# Patient Record
Sex: Male | Born: 1950 | Race: Black or African American | Hispanic: No | Marital: Married | Smoking: Former smoker
Health system: Southern US, Community
[De-identification: ages and names within clinical notes are randomized; demographics above are authoritative.]

## PROBLEM LIST (undated history)

## (undated) DIAGNOSIS — C801 Malignant (primary) neoplasm, unspecified: Secondary | ICD-10-CM

## (undated) HISTORY — DX: Malignant (primary) neoplasm, unspecified: C80.1

## (undated) HISTORY — PX: TONSILLECTOMY: SUR1361

## (undated) HISTORY — PX: SHOULDER SURGERY: SHX246

## (undated) HISTORY — PX: OTHER SURGICAL HISTORY: SHX169

---

## 2016-09-14 DIAGNOSIS — R059 Cough, unspecified: Secondary | ICD-10-CM | POA: Insufficient documentation

## 2016-09-14 DIAGNOSIS — N2889 Other specified disorders of kidney and ureter: Secondary | ICD-10-CM | POA: Insufficient documentation

## 2017-01-14 HISTORY — PX: ROBOTIC ASSITED PARTIAL NEPHRECTOMY: SHX6087

## 2017-09-02 ENCOUNTER — Encounter: Payer: Self-pay | Admitting: Hematology

## 2017-09-02 ENCOUNTER — Telehealth: Payer: Self-pay | Admitting: Hematology

## 2017-09-02 NOTE — Telephone Encounter (Signed)
New oncology referral received from Dr. Brendia Sacks for lytic bone lesions, suspicious for multiple myeloma. Spoke to the pt's wife to schedule an appt. Pt has been scheduled to see Dr. Irene Limbo on 9/3 at [place] the pt should arrive 30 minutes early. Letter mailed.
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
## 2017-09-16 ENCOUNTER — Telehealth: Payer: Self-pay | Admitting: Hematology

## 2017-09-16 ENCOUNTER — Inpatient Hospital Stay: Payer: Medicare Other

## 2017-09-16 ENCOUNTER — Encounter: Payer: Self-pay | Admitting: Hematology

## 2017-09-16 ENCOUNTER — Inpatient Hospital Stay: Payer: Medicare Other | Attending: Hematology | Admitting: Hematology

## 2017-09-16 VITALS — BP 149/88 | HR 87 | Temp 98.3°F | Resp 18 | Ht 68.0 in | Wt 244.1 lb

## 2017-09-16 DIAGNOSIS — G9589 Other specified diseases of spinal cord: Secondary | ICD-10-CM

## 2017-09-16 DIAGNOSIS — Z85828 Personal history of other malignant neoplasm of skin: Secondary | ICD-10-CM | POA: Diagnosis not present

## 2017-09-16 DIAGNOSIS — C7951 Secondary malignant neoplasm of bone: Secondary | ICD-10-CM

## 2017-09-16 DIAGNOSIS — Z809 Family history of malignant neoplasm, unspecified: Secondary | ICD-10-CM | POA: Insufficient documentation

## 2017-09-16 DIAGNOSIS — R911 Solitary pulmonary nodule: Secondary | ICD-10-CM | POA: Diagnosis not present

## 2017-09-16 DIAGNOSIS — Z85528 Personal history of other malignant neoplasm of kidney: Secondary | ICD-10-CM | POA: Diagnosis not present

## 2017-09-16 DIAGNOSIS — E559 Vitamin D deficiency, unspecified: Secondary | ICD-10-CM

## 2017-09-16 DIAGNOSIS — R519 Headache, unspecified: Secondary | ICD-10-CM

## 2017-09-16 DIAGNOSIS — M899 Disorder of bone, unspecified: Secondary | ICD-10-CM | POA: Diagnosis present

## 2017-09-16 DIAGNOSIS — R51 Headache: Secondary | ICD-10-CM

## 2017-09-16 DIAGNOSIS — C801 Malignant (primary) neoplasm, unspecified: Secondary | ICD-10-CM

## 2017-09-16 LAB — CBC WITH DIFFERENTIAL/PLATELET
BASOS PCT: 0 %
Basophils Absolute: 0 10*3/uL (ref 0.0–0.1)
EOS ABS: 0.1 10*3/uL (ref 0.0–0.5)
EOS PCT: 1 %
HCT: 38.3 % — ABNORMAL LOW (ref 38.4–49.9)
Hemoglobin: 12.1 g/dL — ABNORMAL LOW (ref 13.0–17.1)
Lymphocytes Relative: 22 %
Lymphs Abs: 2.9 10*3/uL (ref 0.9–3.3)
MCH: 26.8 pg — ABNORMAL LOW (ref 27.2–33.4)
MCHC: 31.6 g/dL — ABNORMAL LOW (ref 32.0–36.0)
MCV: 84.9 fL (ref 79.3–98.0)
MONO ABS: 0.9 10*3/uL (ref 0.1–0.9)
Monocytes Relative: 7 %
Neutro Abs: 9.5 10*3/uL — ABNORMAL HIGH (ref 1.5–6.5)
Neutrophils Relative %: 70 %
PLATELETS: 265 10*3/uL (ref 140–400)
RBC: 4.51 MIL/uL (ref 4.20–5.82)
RDW: 13.8 % (ref 11.0–14.6)
WBC: 13.4 10*3/uL — ABNORMAL HIGH (ref 4.0–10.3)

## 2017-09-16 LAB — CMP (CANCER CENTER ONLY)
ALT: <6 U/L (ref 0–44)
AST: 11 U/L — ABNORMAL LOW (ref 15–41)
Albumin: 3.5 g/dL (ref 3.5–5.0)
Alkaline Phosphatase: 146 U/L — ABNORMAL HIGH (ref 38–126)
Anion gap: 10 (ref 5–15)
BUN: 17 mg/dL (ref 8–23)
CO2: 25 mmol/L (ref 22–32)
Calcium: 9.4 mg/dL (ref 8.9–10.3)
Chloride: 106 mmol/L (ref 98–111)
Creatinine: 1.34 mg/dL — ABNORMAL HIGH (ref 0.61–1.24)
GFR, Est AFR Am: >60 mL/min
GFR, Estimated: 53 mL/min — ABNORMAL LOW
Glucose, Bld: 98 mg/dL (ref 70–99)
Potassium: 4.3 mmol/L (ref 3.5–5.1)
Sodium: 141 mmol/L (ref 135–145)
Total Bilirubin: 0.5 mg/dL (ref 0.3–1.2)
Total Protein: 7.6 g/dL (ref 6.5–8.1)

## 2017-09-16 LAB — SEDIMENTATION RATE: Sed Rate: 54 mm/h — ABNORMAL HIGH (ref 0–16)

## 2017-09-16 LAB — LACTATE DEHYDROGENASE: LDH: 154 U/L (ref 98–192)

## 2017-09-16 NOTE — Telephone Encounter (Signed)
Scheduled appt per 9/3 los -gave patient AVS and calender per los.  

## 2017-09-17 LAB — KAPPA/LAMBDA LIGHT CHAINS
Kappa free light chain: 63.2 mg/L — ABNORMAL HIGH (ref 3.3–19.4)
Kappa, lambda light chain ratio: 2.57 — ABNORMAL HIGH (ref 0.26–1.65)
Lambda free light chains: 24.6 mg/L (ref 5.7–26.3)

## 2017-09-17 LAB — MULTIPLE MYELOMA PANEL, SERUM
ALBUMIN SERPL ELPH-MCNC: 3.4 g/dL (ref 2.9–4.4)
Albumin/Glob SerPl: 1 (ref 0.7–1.7)
Alpha 1: 0.3 g/dL (ref 0.0–0.4)
Alpha2 Glob SerPl Elph-Mcnc: 0.7 g/dL (ref 0.4–1.0)
B-GLOBULIN SERPL ELPH-MCNC: 1.3 g/dL (ref 0.7–1.3)
GAMMA GLOB SERPL ELPH-MCNC: 1.2 g/dL (ref 0.4–1.8)
GLOBULIN, TOTAL: 3.5 g/dL (ref 2.2–3.9)
IgA: 425 mg/dL (ref 61–437)
IgG (Immunoglobin G), Serum: 1328 mg/dL (ref 700–1600)
IgM (Immunoglobulin M), Srm: 31 mg/dL (ref 20–172)
Total Protein ELP: 6.9 g/dL (ref 6.0–8.5)

## 2017-09-17 LAB — PROSTATE-SPECIFIC AG, SERUM (LABCORP): Prostate Specific Ag, Serum: 0.8 ng/mL (ref 0.0–4.0)

## 2017-09-17 LAB — VITAMIN D 25 HYDROXY (VIT D DEFICIENCY, FRACTURES): VIT D 25 HYDROXY: 9.9 ng/mL — AB (ref 30.0–100.0)

## 2017-09-17 LAB — BETA 2 MICROGLOBULIN, SERUM: Beta-2 Microglobulin: 2.6 mg/L — ABNORMAL HIGH (ref 0.6–2.4)

## 2017-09-26 ENCOUNTER — Ambulatory Visit (HOSPITAL_COMMUNITY)
Admission: RE | Admit: 2017-09-26 | Discharge: 2017-09-26 | Disposition: A | Discharge status: Home / Self Care | Payer: Medicare Other | Source: Ambulatory Visit | Attending: Hematology | Admitting: Hematology

## 2017-09-26 DIAGNOSIS — R9389 Abnormal findings on diagnostic imaging of other specified body structures: Secondary | ICD-10-CM | POA: Diagnosis not present

## 2017-09-26 DIAGNOSIS — R911 Solitary pulmonary nodule: Secondary | ICD-10-CM | POA: Insufficient documentation

## 2017-09-26 DIAGNOSIS — I700 Atherosclerosis of aorta: Secondary | ICD-10-CM | POA: Insufficient documentation

## 2017-09-26 DIAGNOSIS — I77811 Abdominal aortic ectasia: Secondary | ICD-10-CM | POA: Insufficient documentation

## 2017-09-26 DIAGNOSIS — C7951 Secondary malignant neoplasm of bone: Secondary | ICD-10-CM

## 2017-09-26 DIAGNOSIS — C801 Malignant (primary) neoplasm, unspecified: Secondary | ICD-10-CM

## 2017-09-26 LAB — GLUCOSE, CAPILLARY: Glucose-Capillary: 95 mg/dL (ref 70–99)

## 2017-09-26 MED ORDER — FLUDEOXYGLUCOSE F - 18 (FDG) INJECTION
12.2500 | Freq: Once | INTRAVENOUS | Status: AC
  Administered 2017-09-26: 12.25 via INTRAVENOUS

## 2017-09-30 ENCOUNTER — Ambulatory Visit (HOSPITAL_COMMUNITY)
Admission: RE | Admit: 2017-09-30 | Discharge: 2017-09-30 | Disposition: A | Discharge status: Home / Self Care | Payer: Medicare Other | Source: Ambulatory Visit | Attending: Hematology | Admitting: Hematology

## 2017-09-30 ENCOUNTER — Encounter (HOSPITAL_COMMUNITY): Payer: Self-pay

## 2017-09-30 DIAGNOSIS — R519 Headache, unspecified: Secondary | ICD-10-CM

## 2017-09-30 DIAGNOSIS — C801 Malignant (primary) neoplasm, unspecified: Secondary | ICD-10-CM | POA: Insufficient documentation

## 2017-09-30 DIAGNOSIS — R51 Headache: Secondary | ICD-10-CM | POA: Diagnosis present

## 2017-09-30 MED ORDER — IOHEXOL 300 MG/ML  SOLN
75.0000 mL | Freq: Once | INTRAMUSCULAR | Status: AC | PRN
  Administered 2017-09-30: 75 mL via INTRAVENOUS
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
## 2017-10-08 ENCOUNTER — Inpatient Hospital Stay (HOSPITAL_BASED_OUTPATIENT_CLINIC_OR_DEPARTMENT_OTHER): Payer: Medicare Other | Admitting: Hematology

## 2017-10-08 ENCOUNTER — Telehealth: Payer: Self-pay

## 2017-10-08 VITALS — BP 164/94 | HR 92 | Temp 98.4°F | Resp 18 | Ht 68.0 in | Wt 243.4 lb

## 2017-10-08 DIAGNOSIS — G9589 Other specified diseases of spinal cord: Secondary | ICD-10-CM | POA: Diagnosis not present

## 2017-10-08 DIAGNOSIS — Z85528 Personal history of other malignant neoplasm of kidney: Secondary | ICD-10-CM | POA: Diagnosis not present

## 2017-10-08 DIAGNOSIS — R911 Solitary pulmonary nodule: Secondary | ICD-10-CM | POA: Diagnosis not present

## 2017-10-08 DIAGNOSIS — E559 Vitamin D deficiency, unspecified: Secondary | ICD-10-CM

## 2017-10-08 DIAGNOSIS — M899 Disorder of bone, unspecified: Secondary | ICD-10-CM | POA: Diagnosis not present

## 2017-10-08 MED ORDER — ERGOCALCIFEROL 1.25 MG (50000 UT) PO CAPS: 50000.0000 [IU] | ORAL_CAPSULE | ORAL | 0 refills | Status: DC

## 2017-10-08 NOTE — Telephone Encounter (Signed)
Printed avs and calender of upcoming appointment. Per 9/25 los 
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
## 2018-02-05 ENCOUNTER — Inpatient Hospital Stay: Payer: Medicare Other | Attending: Hematology

## 2018-02-05 ENCOUNTER — Telehealth: Payer: Self-pay

## 2018-02-05 ENCOUNTER — Inpatient Hospital Stay (HOSPITAL_BASED_OUTPATIENT_CLINIC_OR_DEPARTMENT_OTHER): Payer: Medicare Other | Admitting: Hematology

## 2018-02-05 VITALS — BP 180/80 | HR 83 | Temp 98.4°F | Resp 18 | Ht 68.0 in | Wt 233.9 lb

## 2018-02-05 DIAGNOSIS — R918 Other nonspecific abnormal finding of lung field: Secondary | ICD-10-CM

## 2018-02-05 DIAGNOSIS — Z79899 Other long term (current) drug therapy: Secondary | ICD-10-CM | POA: Insufficient documentation

## 2018-02-05 DIAGNOSIS — R937 Abnormal findings on diagnostic imaging of other parts of musculoskeletal system: Secondary | ICD-10-CM | POA: Insufficient documentation

## 2018-02-05 DIAGNOSIS — M899 Disorder of bone, unspecified: Secondary | ICD-10-CM

## 2018-02-05 DIAGNOSIS — Z85528 Personal history of other malignant neoplasm of kidney: Secondary | ICD-10-CM

## 2018-02-05 DIAGNOSIS — Z87891 Personal history of nicotine dependence: Secondary | ICD-10-CM

## 2018-02-05 DIAGNOSIS — E559 Vitamin D deficiency, unspecified: Secondary | ICD-10-CM

## 2018-02-05 DIAGNOSIS — C7951 Secondary malignant neoplasm of bone: Secondary | ICD-10-CM

## 2018-02-05 LAB — CBC WITH DIFFERENTIAL/PLATELET
Abs Immature Granulocytes: 0.03 10*3/uL (ref 0.00–0.07)
Basophils Absolute: 0.1 10*3/uL (ref 0.0–0.1)
Basophils Relative: 1 %
EOS ABS: 0.2 10*3/uL (ref 0.0–0.5)
EOS PCT: 2 %
HEMATOCRIT: 39.4 % (ref 39.0–52.0)
HEMOGLOBIN: 12.1 g/dL — AB (ref 13.0–17.0)
Immature Granulocytes: 0 %
LYMPHS PCT: 27 %
Lymphs Abs: 2.6 10*3/uL (ref 0.7–4.0)
MCH: 26.8 pg (ref 26.0–34.0)
MCHC: 30.7 g/dL (ref 30.0–36.0)
MCV: 87.2 fL (ref 80.0–100.0)
MONO ABS: 0.8 10*3/uL (ref 0.1–1.0)
Monocytes Relative: 9 %
Neutro Abs: 6 10*3/uL (ref 1.7–7.7)
Neutrophils Relative %: 61 %
Platelets: 243 10*3/uL (ref 150–400)
RBC: 4.52 MIL/uL (ref 4.22–5.81)
RDW: 13.2 % (ref 11.5–15.5)
WBC: 9.7 10*3/uL (ref 4.0–10.5)
nRBC: 0 % (ref 0.0–0.2)

## 2018-02-05 LAB — CMP (CANCER CENTER ONLY)
ALK PHOS: 139 U/L — AB (ref 38–126)
ALT: 10 U/L (ref 0–44)
AST: 15 U/L (ref 15–41)
Albumin: 3.8 g/dL (ref 3.5–5.0)
Anion gap: 7 (ref 5–15)
BILIRUBIN TOTAL: 0.6 mg/dL (ref 0.3–1.2)
BUN: 25 mg/dL — AB (ref 8–23)
CALCIUM: 9.3 mg/dL (ref 8.9–10.3)
CHLORIDE: 107 mmol/L (ref 98–111)
CO2: 27 mmol/L (ref 22–32)
CREATININE: 1.52 mg/dL — AB (ref 0.61–1.24)
GFR, EST NON AFRICAN AMERICAN: 46 mL/min — AB (ref >60)
GFR, Est AFR Am: 54 mL/min — ABNORMAL LOW (ref >60)
Glucose, Bld: 96 mg/dL (ref 70–99)
Potassium: 5 mmol/L (ref 3.5–5.1)
Sodium: 141 mmol/L (ref 135–145)
Total Protein: 7.4 g/dL (ref 6.5–8.1)

## 2018-02-05 MED ORDER — ERGOCALCIFEROL 1.25 MG (50000 UT) PO CAPS: 50000.0000 [IU] | ORAL_CAPSULE | ORAL | 0 refills | Status: DC

## 2018-02-05 NOTE — Telephone Encounter (Signed)
Printed avs and calender of upcoming appointment. Per 1/23 los also gave patient contrast, and CT information

## 2018-02-06 LAB — KAPPA/LAMBDA LIGHT CHAINS
Kappa free light chain: 63.6 mg/L — ABNORMAL HIGH (ref 3.3–19.4)
Kappa, lambda light chain ratio: 2.6 — ABNORMAL HIGH (ref 0.26–1.65)
Lambda free light chains: 24.5 mg/L (ref 5.7–26.3)

## 2018-02-06 LAB — VITAMIN D 25 HYDROXY (VIT D DEFICIENCY, FRACTURES): VIT D 25 HYDROXY: 32.3 ng/mL (ref 30.0–100.0)

## 2018-02-08 LAB — MULTIPLE MYELOMA PANEL, SERUM
ALBUMIN/GLOB SERPL: 1.4 (ref 0.7–1.7)
Albumin SerPl Elph-Mcnc: 3.9 g/dL (ref 2.9–4.4)
Alpha 1: 0.2 g/dL (ref 0.0–0.4)
Alpha2 Glob SerPl Elph-Mcnc: 0.5 g/dL (ref 0.4–1.0)
B-Globulin SerPl Elph-Mcnc: 1 g/dL (ref 0.7–1.3)
Gamma Glob SerPl Elph-Mcnc: 1.2 g/dL (ref 0.4–1.8)
Globulin, Total: 3 g/dL (ref 2.2–3.9)
IGM (IMMUNOGLOBULIN M), SRM: 26 mg/dL (ref 20–172)
IgA: 386 mg/dL (ref 61–437)
IgG (Immunoglobin G), Serum: 1315 mg/dL (ref 700–1600)
Total Protein ELP: 6.9 g/dL (ref 6.0–8.5)

## 2018-04-30 ENCOUNTER — Ambulatory Visit (HOSPITAL_COMMUNITY): Admission: RE | Admit: 2018-04-30 | Payer: Medicare Other | Source: Ambulatory Visit

## 2018-05-05 ENCOUNTER — Telehealth: Payer: Self-pay | Admitting: Hematology

## 2018-05-05 NOTE — Telephone Encounter (Signed)
Called patient per 4/21 sch message - unable to reach patient to reschedule appt - left message for patient to call back

## 2018-05-07 ENCOUNTER — Ambulatory Visit: Payer: Medicare Other | Admitting: Hematology

## 2018-05-07 ENCOUNTER — Other Ambulatory Visit: Payer: Medicare Other

## 2018-05-09 ENCOUNTER — Other Ambulatory Visit: Payer: Self-pay | Admitting: Hematology

## 2018-08-06 ENCOUNTER — Inpatient Hospital Stay: Payer: Medicare Other | Admitting: Hematology

## 2018-08-06 ENCOUNTER — Inpatient Hospital Stay: Payer: Medicare Other

## 2020-03-25 ENCOUNTER — Other Ambulatory Visit: Payer: Self-pay

## 2020-03-25 ENCOUNTER — Emergency Department (HOSPITAL_COMMUNITY): Payer: Medicare Other

## 2020-03-25 ENCOUNTER — Encounter (HOSPITAL_COMMUNITY): Payer: Self-pay | Admitting: Emergency Medicine

## 2020-03-25 ENCOUNTER — Inpatient Hospital Stay (HOSPITAL_COMMUNITY)
Admission: EM | Admit: 2020-03-25 | Discharge: 2020-03-28 | DRG: 305 | Disposition: A | Discharge status: Home / Self Care | Payer: Medicare Other | Attending: Internal Medicine | Admitting: Internal Medicine

## 2020-03-25 DIAGNOSIS — E876 Hypokalemia: Secondary | ICD-10-CM | POA: Diagnosis not present

## 2020-03-25 DIAGNOSIS — N1832 Chronic kidney disease, stage 3b: Secondary | ICD-10-CM | POA: Diagnosis present

## 2020-03-25 DIAGNOSIS — I160 Hypertensive urgency: Principal | ICD-10-CM | POA: Diagnosis present

## 2020-03-25 DIAGNOSIS — Z87891 Personal history of nicotine dependence: Secondary | ICD-10-CM

## 2020-03-25 DIAGNOSIS — I10 Essential (primary) hypertension: Secondary | ICD-10-CM

## 2020-03-25 DIAGNOSIS — N400 Enlarged prostate without lower urinary tract symptoms: Secondary | ICD-10-CM | POA: Diagnosis present

## 2020-03-25 DIAGNOSIS — I351 Nonrheumatic aortic (valve) insufficiency: Secondary | ICD-10-CM | POA: Diagnosis not present

## 2020-03-25 DIAGNOSIS — Z9114 Patient's other noncompliance with medication regimen: Secondary | ICD-10-CM

## 2020-03-25 DIAGNOSIS — Z905 Acquired absence of kidney: Secondary | ICD-10-CM

## 2020-03-25 DIAGNOSIS — M545 Low back pain, unspecified: Secondary | ICD-10-CM | POA: Diagnosis not present

## 2020-03-25 DIAGNOSIS — Z20822 Contact with and (suspected) exposure to covid-19: Secondary | ICD-10-CM | POA: Diagnosis present

## 2020-03-25 DIAGNOSIS — Z79899 Other long term (current) drug therapy: Secondary | ICD-10-CM

## 2020-03-25 DIAGNOSIS — I130 Hypertensive heart and chronic kidney disease with heart failure and stage 1 through stage 4 chronic kidney disease, or unspecified chronic kidney disease: Secondary | ICD-10-CM | POA: Diagnosis present

## 2020-03-25 DIAGNOSIS — I5020 Unspecified systolic (congestive) heart failure: Secondary | ICD-10-CM | POA: Diagnosis present

## 2020-03-25 DIAGNOSIS — R079 Chest pain, unspecified: Secondary | ICD-10-CM

## 2020-03-25 DIAGNOSIS — G8929 Other chronic pain: Secondary | ICD-10-CM | POA: Diagnosis not present

## 2020-03-25 DIAGNOSIS — R9431 Abnormal electrocardiogram [ECG] [EKG]: Secondary | ICD-10-CM | POA: Diagnosis not present

## 2020-03-25 DIAGNOSIS — Z85528 Personal history of other malignant neoplasm of kidney: Secondary | ICD-10-CM | POA: Diagnosis not present

## 2020-03-25 HISTORY — DX: Essential (primary) hypertension: I10

## 2020-03-25 LAB — TROPONIN I (HIGH SENSITIVITY)
Troponin I (High Sensitivity): 29 ng/L — ABNORMAL HIGH (ref <18)
Troponin I (High Sensitivity): 16 ng/L (ref <18)

## 2020-03-25 LAB — CBC WITH DIFFERENTIAL/PLATELET
Abs Immature Granulocytes: 0.03 10*3/uL (ref 0.00–0.07)
Basophils Absolute: 0.1 10*3/uL (ref 0.0–0.1)
Basophils Relative: 1 %
Eosinophils Absolute: 0.1 10*3/uL (ref 0.0–0.5)
Eosinophils Relative: 1 %
HCT: 42.8 % (ref 39.0–52.0)
Hemoglobin: 13.4 g/dL (ref 13.0–17.0)
Immature Granulocytes: 0 %
Lymphocytes Relative: 22 %
Lymphs Abs: 2.2 10*3/uL (ref 0.7–4.0)
MCH: 26.5 pg (ref 26.0–34.0)
MCHC: 31.3 g/dL (ref 30.0–36.0)
MCV: 84.6 fL (ref 80.0–100.0)
Monocytes Absolute: 0.8 10*3/uL (ref 0.1–1.0)
Monocytes Relative: 7 %
Neutro Abs: 7.2 10*3/uL (ref 1.7–7.7)
Neutrophils Relative %: 69 %
Platelets: 263 10*3/uL (ref 150–400)
RBC: 5.06 MIL/uL (ref 4.22–5.81)
RDW: 13.7 % (ref 11.5–15.5)
WBC: 10.4 10*3/uL (ref 4.0–10.5)
nRBC: 0 % (ref 0.0–0.2)

## 2020-03-25 LAB — COMPREHENSIVE METABOLIC PANEL
ALT: 9 U/L (ref 0–44)
AST: 14 U/L — ABNORMAL LOW (ref 15–41)
Albumin: 3.8 g/dL (ref 3.5–5.0)
Alkaline Phosphatase: 111 U/L (ref 38–126)
Anion gap: 7 (ref 5–15)
BUN: 18 mg/dL (ref 8–23)
CO2: 28 mmol/L (ref 22–32)
Calcium: 9 mg/dL (ref 8.9–10.3)
Chloride: 105 mmol/L (ref 98–111)
Creatinine, Ser: 1.58 mg/dL — ABNORMAL HIGH (ref 0.61–1.24)
GFR, Estimated: 47 mL/min — ABNORMAL LOW (ref >60)
Glucose, Bld: 99 mg/dL (ref 70–99)
Potassium: 3.4 mmol/L — ABNORMAL LOW (ref 3.5–5.1)
Sodium: 140 mmol/L (ref 135–145)
Total Bilirubin: 1 mg/dL (ref 0.3–1.2)
Total Protein: 7.4 g/dL (ref 6.5–8.1)

## 2020-03-25 LAB — D-DIMER, QUANTITATIVE: D-Dimer, Quant: 0.69 ug/mL-FEU — ABNORMAL HIGH (ref 0.00–0.50)

## 2020-03-25 LAB — BRAIN NATRIURETIC PEPTIDE: B Natriuretic Peptide: 90.7 pg/mL (ref 0.0–100.0)

## 2020-03-25 LAB — LACTIC ACID, PLASMA: Lactic Acid, Venous: 1.1 mmol/L (ref 0.5–1.9)

## 2020-03-25 MED ORDER — METHADONE HCL 10 MG PO TABS
10.0000 mg | ORAL_TABLET | Freq: Every day | ORAL | Status: DC
  Administered 2020-03-26 – 2020-03-27 (×2): 10 mg via ORAL
  Filled 2020-03-25 (×2): qty 1

## 2020-03-25 MED ORDER — ATORVASTATIN CALCIUM 40 MG PO TABS
40.0000 mg | ORAL_TABLET | Freq: Every day | ORAL | Status: DC
  Administered 2020-03-26 – 2020-03-28 (×4): 40 mg via ORAL
  Filled 2020-03-25 (×4): qty 1

## 2020-03-25 MED ORDER — ONDANSETRON HCL 4 MG/2ML IJ SOLN: 4.0000 mg | Freq: Four times a day (QID) | INTRAMUSCULAR | Status: DC | PRN

## 2020-03-25 MED ORDER — AMLODIPINE BESYLATE 5 MG PO TABS
5.0000 mg | ORAL_TABLET | Freq: Every day | ORAL | Status: DC
  Administered 2020-03-26 – 2020-03-28 (×4): 5 mg via ORAL
  Filled 2020-03-25 (×4): qty 1

## 2020-03-25 MED ORDER — TRIAMTERENE-HCTZ 37.5-25 MG PO TABS: 1.0000 | ORAL_TABLET | Freq: Every morning | ORAL | Status: DC

## 2020-03-25 MED ORDER — TRIAMTERENE-HCTZ 37.5-25 MG PO TABS
1.0000 | ORAL_TABLET | Freq: Every morning | ORAL | Status: DC
  Administered 2020-03-26 – 2020-03-28 (×3): 1 via ORAL
  Filled 2020-03-25 (×3): qty 1

## 2020-03-25 MED ORDER — HYDROMORPHONE HCL 2 MG/ML IJ SOLN
2.0000 mg | Freq: Once | INTRAMUSCULAR | Status: AC
  Administered 2020-03-25: 2 mg via INTRAVENOUS
  Filled 2020-03-25: qty 1

## 2020-03-25 MED ORDER — AMLODIPINE BESYLATE 5 MG PO TABS: 5.0000 mg | ORAL_TABLET | Freq: Every day | ORAL | Status: DC

## 2020-03-25 MED ORDER — METHADONE HCL 5 MG PO TABS: 10.0000 mg | ORAL_TABLET | Freq: Two times a day (BID) | ORAL | Status: DC

## 2020-03-25 MED ORDER — SODIUM CHLORIDE 0.9 % IV BOLUS
500.0000 mL | Freq: Once | INTRAVENOUS | Status: AC
  Administered 2020-03-25: 500 mL via INTRAVENOUS

## 2020-03-25 MED ORDER — POTASSIUM CHLORIDE 20 MEQ PO PACK
40.0000 meq | PACK | Freq: Once | ORAL | Status: AC
  Administered 2020-03-26: 40 meq via ORAL
  Filled 2020-03-25: qty 2

## 2020-03-25 MED ORDER — ONDANSETRON HCL 4 MG/2ML IJ SOLN
4.0000 mg | Freq: Once | INTRAMUSCULAR | Status: AC
  Administered 2020-03-25: 4 mg via INTRAVENOUS
  Filled 2020-03-25: qty 2

## 2020-03-25 MED ORDER — GABAPENTIN 300 MG PO CAPS
300.0000 mg | ORAL_CAPSULE | Freq: Every day | ORAL | Status: DC
  Administered 2020-03-26 – 2020-03-27 (×2): 300 mg via ORAL
  Filled 2020-03-25 (×2): qty 1

## 2020-03-25 MED ORDER — ASPIRIN 81 MG PO CHEW
81.0000 mg | CHEWABLE_TABLET | Freq: Every day | ORAL | Status: DC
  Administered 2020-03-26 – 2020-03-28 (×3): 81 mg via ORAL
  Filled 2020-03-25 (×3): qty 1

## 2020-03-25 MED ORDER — TAMSULOSIN HCL 0.4 MG PO CAPS
0.4000 mg | ORAL_CAPSULE | Freq: Every day | ORAL | Status: DC
  Administered 2020-03-26 – 2020-03-28 (×3): 0.4 mg via ORAL
  Filled 2020-03-25 (×3): qty 1

## 2020-03-25 MED ORDER — ENOXAPARIN SODIUM 40 MG/0.4ML ~~LOC~~ SOLN
40.0000 mg | SUBCUTANEOUS | Status: DC
  Administered 2020-03-26 – 2020-03-27 (×3): 40 mg via SUBCUTANEOUS
  Filled 2020-03-25 (×3): qty 0.4

## 2020-03-25 MED ORDER — METOPROLOL TARTRATE 25 MG PO TABS: 50.0000 mg | ORAL_TABLET | Freq: Two times a day (BID) | ORAL | Status: DC

## 2020-03-25 MED ORDER — IOHEXOL 350 MG/ML SOLN
100.0000 mL | Freq: Once | INTRAVENOUS | Status: AC | PRN
  Administered 2020-03-25: 71 mL via INTRAVENOUS

## 2020-03-25 MED ORDER — ASPIRIN 325 MG PO TABS
325.0000 mg | ORAL_TABLET | Freq: Once | ORAL | Status: AC
  Administered 2020-03-26: 325 mg via ORAL
  Filled 2020-03-25: qty 1

## 2020-03-25 MED ORDER — NITROGLYCERIN 2 % TD OINT
0.5000 [in_us] | TOPICAL_OINTMENT | Freq: Four times a day (QID) | TRANSDERMAL | Status: DC
  Administered 2020-03-26 – 2020-03-28 (×9): 0.5 [in_us] via TOPICAL
  Filled 2020-03-25: qty 30

## 2020-03-25 MED ORDER — METOPROLOL TARTRATE 50 MG PO TABS
50.0000 mg | ORAL_TABLET | Freq: Two times a day (BID) | ORAL | Status: DC
  Administered 2020-03-26 – 2020-03-28 (×6): 50 mg via ORAL
  Filled 2020-03-25 (×4): qty 1
  Filled 2020-03-25: qty 2
  Filled 2020-03-25: qty 1

## 2020-03-25 MED ORDER — ACETAMINOPHEN 325 MG PO TABS
650.0000 mg | ORAL_TABLET | ORAL | Status: DC | PRN
  Administered 2020-03-26 – 2020-03-28 (×6): 650 mg via ORAL
  Filled 2020-03-25 (×7): qty 2

## 2020-03-25 NOTE — ED Triage Notes (Signed)
Patient reports constant central dull chest pain x2 months.

## 2020-03-25 NOTE — H&P (Signed)
[facility] @ [place] Admission History and Physical [employer], D.O.  ---------------------------------------------------------------------------------------------------------------------   PATIENT NAMEJony Dixon MR#: [id number] DATE OF BIRTH: [birth year] DATE OF ADMISSION: 03/25/2020 PRIMARY CARE PHYSICIAN: Patient, No Pcp Per  REQUESTING/REFERRING PHYSICIAN: ED Dr. Joya Gaskins  CHIEF COMPLAINT: Chief Complaint  Patient presents with  . Chest Pain    HISTORY OF PRESENT ILLNESS: Ethan Dixon is a 70 y.o. male with a known history of renal cell carcinoma, CKD, HTN, BPH, chronic pain presents to the emergency department for evaluation of chest and back pain.  Patient was in a usual state of health until 3 months ago when he lost his relationship with his doctor who was prescribing him methadone and gabapentin.  He has not been taking his medication since about one week ago.  When he lost his doctor he tapered don on his meds because he knew it would be difficult to obtain them.  He finally ran out about 1 week ago.  He reports generalized symptoms such as weakness, headache, fatigue, chest pain, dyspnea on exertion for the past several months..  He takes gabapentin for neuropathic right leg pain and methadone for chronic back pain.    He would like to establish with a new PCP.   EMS/ED COURSE:   Patient received Dilaudid 2 mg x 2 doses, Zofran, normal saline.  Troponin was found to be elevated along with the patient's blood pressure.  Medical admission was requested for further work-up and management of elevated troponin and severe uncontrolled hypertension as well as pain likely chronic back pain.  PAST MEDICAL HISTORY: Past Medical History:  Diagnosis Date  . Cancer [facility])    Renal cell carcinoma left kidney      PAST SURGICAL HISTORY: Past Surgical History:  Procedure Laterality Date  . ROBOTIC ASSITED PARTIAL NEPHRECTOMY  2019      SOCIAL  HISTORY: Social History   Tobacco Use  . Smoking status: Former Smoker    Types: Cigarettes    Quit date: 2012    Years since quitting: 10.2  . Smokeless tobacco: Never Used  Substance Use Topics  . Alcohol use: Not Currently      FAMILY HISTORY: Family History  Problem Relation Age of Onset  . Diabetes Mother   . Hypertension Mother   . Diabetes Father   . Hypertension Father   . Cancer Sister   . Heart disease Sister   . Stroke Brother   . Cancer Sister      MEDICATIONS AT HOME: Prior to Admission medications   Medication Sig Start Date End Date Taking? Authorizing Provider  amLODipine (NORVASC) 5 MG tablet Take 5 mg by mouth daily.   Yes [provider]  gabapentin (NEURONTIN) 300 MG capsule Take 300 mg by mouth daily. 01/14/20  Yes [provider]  methadone (DOLOPHINE) 10 MG tablet Take 10 mg by mouth every 12 (twelve) hours.   Yes [provider]  metoprolol tartrate (LOPRESSOR) 50 MG tablet Take 50 mg by mouth 2 (two) times daily.   Yes [provider]  tamsulosin (FLOMAX) 0.4 MG CAPS capsule Take 0.4 mg by mouth daily. 01/14/20  Yes [provider]  triamterene-hydrochlorothiazide (MAXZIDE-25) 37.5-25 MG tablet Take 1 tablet by mouth every morning. 09/29/19  Yes [provider]  Vitamin D, Ergocalciferol, (DRISDOL) 1.25 MG (50000 UT) CAPS capsule TAKE ONE CAPSULE BY MOUTH TWICE PER WEEK 05/11/18  Yes Brunetta Genera, MD      DRUG ALLERGIES: No Known Allergies  REVIEW OF SYSTEMS: CONSTITUTIONAL: Positive fatigue, weakness, negative fever, chills, weight gain/loss, headache EYES: No blurry or double vision. ENT: No tinnitus, postnasal drip, redness or soreness of the oropharynx. RESPIRATORY: Positive dyspnea, negative cough, wheeze, hemoptysis. CARDIOVASCULAR: Positive chest pain, negative orthopnea, palpitations, syncope. GASTROINTESTINAL: No nausea, vomiting, constipation, diarrhea, abdominal pain. No  hematemesis, melena or hematochezia. GENITOURINARY: No dysuria, frequency, hematuria. ENDOCRINE: No polyuria or nocturia. No heat or cold intolerance. HEMATOLOGY: No anemia, bruising, bleeding. INTEGUMENTARY: No rashes, ulcers, lesions. MUSCULOSKELETAL: Positive muscular chest and back pain NEUROLOGIC: No numbness, tingling, weakness or ataxia. No seizure-type activity. PSYCHIATRIC: No anxiety, depression, insomnia.  PHYSICAL EXAMINATION: VITAL SIGNS: Blood pressure (!) 170/87, pulse 67, temperature 98.5 F (36.9 C), temperature source Oral, resp. rate 15, height 5\' 8"  (1.727 m), weight 122.5 kg, SpO2 97 %.  GENERAL: 70 y.o.-year-old male patient, well-developed, well-nourished lying in the bed in no acute distress.  Pleasant and cooperative.   HEENT: Head atraumatic, normocephalic.  NECK: Supple, full range of motion.  CHEST: Normal breath sounds bilaterally. No wheezing, rales, rhonchi or crackles. No use of accessory muscles of respiration.  No reproducible chest wall tenderness.  CARDIOVASCULAR: S1, S2 normal. No murmurs, rubs, or gallops appreciated. Cap refill <2 seconds. ABDOMEN: Soft, nontender, nondistended. No rebound, guarding, rigidity. Normoactive bowel sounds present in all four quadrants.  EXTREMITIES: Full range of motion. No pedal edema, cyanosis, or clubbing. NEUROLOGIC: Cranial nerves II through XII are grossly intact with no focal sensorimotor deficit. Muscle strength 5/5 in all extremities. Sensation intact. Gait not checked. PSYCHIATRIC: The patient is alert and oriented x 3. Normal affect, mood, thought content. SKIN: Warm, dry, and intact without obvious rash, lesion, or ulcer.  LABORATORY PANEL:  CBC Recent Labs  Lab 03/25/20 1419  WBC 10.4  HGB 13.4  HCT 42.8  PLT 263   ----------------------------------------------------------------------------------------------------------------- Chemistries Recent Labs  Lab 03/25/20 1419  NA 140  K 3.4*  CL 105   CO2 28  GLUCOSE 99  BUN 18  CREATININE 1.58*  CALCIUM 9.0  AST 14*  ALT 9  ALKPHOS 111  BILITOT 1.0   ------------------------------------------------------------------------------------------------------------------ Cardiac Enzymes No results for input(s): TROPONINI in the last 168 hours. ------------------------------------------------------------------------------------------------------------------  RADIOLOGY: DG Chest 2 View  Result Date: 03/25/2020 CLINICAL DATA:  Chest pain.  History of multiple myeloma EXAM: CHEST - 2 VIEW COMPARISON:  PET-CT September 26, 2017 FINDINGS: There is a small left pleural effusion. The lungs elsewhere are clear. Heart size and pulmonary vascularity are normal. No blastic or lytic bone lesions. Bony overgrowth along the lateral left clavicle noted. IMPRESSION: Small left pleural effusion. No edema or airspace opacity. Heart size normal. No findings suggestive of multiple myeloma by radiographic examination. Electronically Signed   By: September 28, 2017 III M.D.   On: 03/25/2020 15:00   DG Lumbar Spine Complete  Result Date: 03/25/2020 CLINICAL DATA:  Evaluate for possible multiple myeloma.  Back pain. EXAM: LUMBAR SPINE - COMPLETE 4+ VIEW COMPARISON:  None. FINDINGS: Degenerative disc disease in the lumbar spine. There is a focus of low attenuation of the superior aspect of T12. No other bony lesions are identified. No fracture or traumatic malalignment. No other acute abnormalities. IMPRESSION: 1. There is a small focus of low attenuation of the superior aspect of T12 which is nonspecific. This could represent artifact versus a small lucent lesion. 2. Degenerative changes. 3. No other abnormalities. Electronically Signed   By: 05/25/2020 III M.D   On: 03/25/2020 15:04   CT Head Wo  Contrast  Result Date: 03/25/2020 CLINICAL DATA:  Headache, uncontrolled hypertension EXAM: CT HEAD WITHOUT CONTRAST TECHNIQUE: Contiguous axial images were obtained  from the base of the skull through the vertex without intravenous contrast. COMPARISON:  09/30/2017 FINDINGS: Brain: No evidence of acute infarction, hemorrhage, hydrocephalus, extra-axial collection or mass lesion/mass effect. Nonspecific subtle hyperdensities within the bilateral basal ganglia are unchanged from prior. Vascular: No hyperdense vessel or unexpected calcification. Skull: Normal. Negative for fracture or focal lesion. Sinuses/Orbits: No acute finding. Other: None. IMPRESSION: No acute intracranial findings. Electronically Signed   By: Davina Poke D.O.   On: 03/25/2020 15:10   CT Angio Chest PE W/Cm &/Or Wo Cm  Result Date: 03/25/2020 CLINICAL DATA:  Chest pain, shortness of breath EXAM: CT ANGIOGRAPHY CHEST WITH CONTRAST TECHNIQUE: Multidetector CT imaging of the chest was performed using the standard protocol during bolus administration of intravenous contrast. Multiplanar CT image reconstructions and MIPs were obtained to evaluate the vascular anatomy. CONTRAST:  32mL OMNIPAQUE IOHEXOL 350 MG/ML SOLN COMPARISON:  PET-CT, 09/26/2017 FINDINGS: Cardiovascular: Satisfactory opacification of the pulmonary arteries to the segmental level. No evidence of pulmonary embolism. Normal heart size. No pericardial effusion. Scattered aortic atherosclerosis. Mediastinum/Nodes: No enlarged mediastinal, hilar, or axillary lymph nodes. Thyroid gland, trachea, and esophagus demonstrate no significant findings. Lungs/Pleura: Diffuse bilateral bronchial wall thickening and scattered bronchial plugging. 9 mm subpleural pulmonary nodule at the right lung base, stable compared to prior PET-CT dated 09/26/2017 and definitively benign (series 6, image 103) no pleural effusion or pneumothorax. Upper Abdomen: No acute abnormality. Musculoskeletal: No chest wall abnormality. No acute or significant osseous findings. Review of the MIP images confirms the above findings. IMPRESSION: 1. Negative examination for pulmonary  embolism. 2. Diffuse bilateral bronchial wall thickening and scattered bronchial plugging, consistent with nonspecific infectious or inflammatory bronchitis. 3. Stable, definitively benign pulmonary nodule of the right lung base, for which no further follow-up or characterization is required. Aortic Atherosclerosis (ICD10-I70.0). Electronically Signed   By: Eddie Candle M.D.   On: 03/25/2020 17:18    EKG: Sinus tachycardia at 100 bpm with normal axis, PVCs, right bundle branch block and nonspecific ST-T wave changes.   IMPRESSION AND PLAN:  This is a 70 y.o. male with a history of renal cell carcinoma, CKD, HTN, BPH, chronic pain now being admitted with: 1. Chest pain, elevated troponin, likely secondary to severe uncontrolled hypertension rule out ACS - Admit to inpatient with telemetry monitoring. - Trend troponins, check lipids and TSH. - Morphine, nitro, beta blocker, aspirin and statin ordered.   -Cardiology consult requested for a.m.  2.  Severe uncontrolled hypertension Resume Lasix, metoprolol, Maxide  .  Mild hypokalemia -Replace orally  4.  History of chronic pain -Resume gabapentin and methadone  5. History of BPH Resume Flomax  6.  History of CKD about baseline -Monitor B MP  Admission status: Inpatient, telemetry Diet/Nutrition: Heart healthy Fluids: HL DVT Px: Lovenox, SCDs and early ambulation Code Status: Full Disposition Plan: To home in 1-2 days  All the records are reviewed and case discussed with ED provider. Management plans discussed with the patient and/or family who express understanding and agree with plan of care.   TOTAL TIME TAKING CARE OF THIS PATIENT: 60 minutes.   Gennette Shadix D.O. on 03/25/2020 at 9:41 PM CC: Primary care physician; Patient, No Pcp Per     Note: This dictation was prepared with Dragon dictation along with smaller phrase technology. Any transcriptional errors that result from this process are unintentional.

## 2020-03-25 NOTE — ED Provider Notes (Signed)
[name] Provider Note   CSN: [id number] Arrival date & time: 03/25/20  1328     History Chief Complaint  Patient presents with  . Chest Pain    Ethan Dixon is a 70 y.o. male.  The patient states that he feels bad all over.  He states that he lost his doctor about 3 months ago and thought that he was withdrawing from his medications.  He was chronically prescribed methadone and gabapentin.  He has been experiencing headache, chest pain, dyspnea on exertion, and diffuse weakness for several months.  He states he came in today because he finally got sick of dealing with the symptoms.  He states that he has no quality of life as a result of his symptoms.  The history is provided by the patient.  Chest Pain Chest pain location: central. Pain quality: dull   Pain radiates to:  Does not radiate Pain severity:  Mild Onset quality:  Gradual Timing:  Constant Progression:  Unchanged Chronicity:  New Context: at rest   Relieved by:  Nothing Worsened by:  Nothing Associated symptoms: back pain, headache, shortness of breath and weakness (diffuse)   Associated symptoms: no abdominal pain, no cough, no diaphoresis, no dizziness, no fever, no lower extremity edema, no palpitations and no vomiting        Past Medical History:  Diagnosis Date  . Cancer [facility])    Renal cell carcinoma left kidney    There are no problems to display for this patient.   Past Surgical History:  Procedure Laterality Date  . ROBOTIC ASSITED PARTIAL NEPHRECTOMY  2019       Family History  Problem Relation Age of Onset  . Diabetes Mother   . Hypertension Mother   . Diabetes Father   . Hypertension Father   . Cancer Sister   . Heart disease Sister   . Stroke Brother   . Cancer Sister     Social History   Tobacco Use  . Smoking status: Former Smoker    Types: Cigarettes    Quit date: 2012    Years since quitting: 10.2  . Smokeless tobacco:  Never Used  Vaping Use  . Vaping Use: Every day  Substance Use Topics  . Alcohol use: Not Currently  . Drug use: Never    Home Medications Prior to Admission medications   Medication Sig Start Date End Date Taking? Authorizing Provider  amLODipine (NORVASC) 5 MG tablet Take 5 mg by mouth daily.    [provider]  methadone (DOLOPHINE) 10 MG tablet Take 10 mg by mouth every 8 (eight) hours.    [provider]  metoprolol tartrate (LOPRESSOR) 25 MG tablet Take 25 mg by mouth 2 (two) times daily.    [provider]  Vitamin D, Ergocalciferol, (DRISDOL) 1.25 MG (50000 UT) CAPS capsule TAKE ONE CAPSULE BY MOUTH TWICE PER WEEK 05/11/18   Brunetta Genera, MD    Allergies    Patient has no known allergies.  Review of Systems   Review of Systems  Constitutional: Negative for chills, diaphoresis and fever.  HENT: Negative for ear pain and sore throat.   Eyes: Negative for pain and visual disturbance.  Respiratory: Positive for shortness of breath. Negative for cough.   Cardiovascular: Positive for chest pain. Negative for palpitations.       Has been out of blood pressure medications for several months.  Gastrointestinal: Negative for abdominal pain and vomiting.  Genitourinary: Negative for  dysuria and hematuria.  Musculoskeletal: Positive for back pain. Negative for arthralgias.  Skin: Negative for color change and rash.  Neurological: Positive for weakness (diffuse) and headaches. Negative for dizziness, seizures and syncope.  All other systems reviewed and are negative.   Physical Exam Updated Vital Signs BP (!) 199/123   Pulse 98   Temp 99.2 F (37.3 C) (Oral)   Resp 18   Ht 5\' 8"  (1.727 m)   Wt 122.5 kg   SpO2 98%   BMI 41.05 kg/m   Physical Exam Vitals and nursing note reviewed.  Constitutional:      Appearance: He is well-developed.  HENT:     Head: Normocephalic and atraumatic.  Eyes:     Conjunctiva/sclera: Conjunctivae normal.   Cardiovascular:     Rate and Rhythm: Regular rhythm. Tachycardia present.     Heart sounds: No murmur heard.   Pulmonary:     Effort: Pulmonary effort is normal. No respiratory distress.     Breath sounds: Normal breath sounds.  Abdominal:     Palpations: Abdomen is soft.     Tenderness: There is no abdominal tenderness.  Musculoskeletal:        General: Normal range of motion.     Cervical back: Neck supple.     Right lower leg: No edema.     Left lower leg: No edema.  Skin:    General: Skin is warm and dry.  Neurological:     General: No focal deficit present.     Mental Status: He is alert.  Psychiatric:        Mood and Affect: Mood normal.     ED Results / Procedures / Treatments   Labs (all labs ordered are listed, but only abnormal results are displayed) Labs Reviewed  D-DIMER, QUANTITATIVE - Abnormal; Notable for the following components:      Result Value   D-Dimer, Quant 0.69 (*)    All other components within normal limits  COMPREHENSIVE METABOLIC PANEL - Abnormal; Notable for the following components:   Potassium 3.4 (*)    Creatinine, Ser 1.58 (*)    AST 14 (*)    GFR, Estimated 47 (*)    All other components within normal limits  TROPONIN I (HIGH SENSITIVITY) - Abnormal; Notable for the following components:   Troponin I (High Sensitivity) 29 (*)    All other components within normal limits  CBC WITH DIFFERENTIAL/PLATELET  LACTIC ACID, PLASMA  BRAIN NATRIURETIC PEPTIDE  LACTIC ACID, PLASMA  TROPONIN I (HIGH SENSITIVITY)    EKG None Right bundle branch block, frequent PVCs, no acute ischemia Radiology DG Chest 2 View  Result Date: 03/25/2020 CLINICAL DATA:  Chest pain.  History of multiple myeloma EXAM: CHEST - 2 VIEW COMPARISON:  PET-CT September 26, 2017 FINDINGS: There is a small left pleural effusion. The lungs elsewhere are clear. Heart size and pulmonary vascularity are normal. No blastic or lytic bone lesions. Bony overgrowth along the  lateral left clavicle noted. IMPRESSION: Small left pleural effusion. No edema or airspace opacity. Heart size normal. No findings suggestive of multiple myeloma by radiographic examination. Electronically Signed   By: September 28, 2017 III M.D.   On: 03/25/2020 15:00   DG Lumbar Spine Complete  Result Date: 03/25/2020 CLINICAL DATA:  Evaluate for possible multiple myeloma.  Back pain. EXAM: LUMBAR SPINE - COMPLETE 4+ VIEW COMPARISON:  None. FINDINGS: Degenerative disc disease in the lumbar spine. There is a focus of low attenuation of the superior aspect of  T12. No other bony lesions are identified. No fracture or traumatic malalignment. No other acute abnormalities. IMPRESSION: 1. There is a small focus of low attenuation of the superior aspect of T12 which is nonspecific. This could represent artifact versus a small lucent lesion. 2. Degenerative changes. 3. No other abnormalities. Electronically Signed   By: Dorise Bullion III M.D   On: 03/25/2020 15:04   CT Head Wo Contrast  Result Date: 03/25/2020 CLINICAL DATA:  Headache, uncontrolled hypertension EXAM: CT HEAD WITHOUT CONTRAST TECHNIQUE: Contiguous axial images were obtained from the base of the skull through the vertex without intravenous contrast. COMPARISON:  09/30/2017 FINDINGS: Brain: No evidence of acute infarction, hemorrhage, hydrocephalus, extra-axial collection or mass lesion/mass effect. Nonspecific subtle hyperdensities within the bilateral basal ganglia are unchanged from prior. Vascular: No hyperdense vessel or unexpected calcification. Skull: Normal. Negative for fracture or focal lesion. Sinuses/Orbits: No acute finding. Other: None. IMPRESSION: No acute intracranial findings. Electronically Signed   By: Davina Poke D.O.   On: 03/25/2020 15:10   CT Angio Chest PE W/Cm &/Or Wo Cm  Result Date: 03/25/2020 CLINICAL DATA:  Chest pain, shortness of breath EXAM: CT ANGIOGRAPHY CHEST WITH CONTRAST TECHNIQUE: Multidetector CT  imaging of the chest was performed using the standard protocol during bolus administration of intravenous contrast. Multiplanar CT image reconstructions and MIPs were obtained to evaluate the vascular anatomy. CONTRAST:  34mL OMNIPAQUE IOHEXOL 350 MG/ML SOLN COMPARISON:  PET-CT, 09/26/2017 FINDINGS: Cardiovascular: Satisfactory opacification of the pulmonary arteries to the segmental level. No evidence of pulmonary embolism. Normal heart size. No pericardial effusion. Scattered aortic atherosclerosis. Mediastinum/Nodes: No enlarged mediastinal, hilar, or axillary lymph nodes. Thyroid gland, trachea, and esophagus demonstrate no significant findings. Lungs/Pleura: Diffuse bilateral bronchial wall thickening and scattered bronchial plugging. 9 mm subpleural pulmonary nodule at the right lung base, stable compared to prior PET-CT dated 09/26/2017 and definitively benign (series 6, image 103) no pleural effusion or pneumothorax. Upper Abdomen: No acute abnormality. Musculoskeletal: No chest wall abnormality. No acute or significant osseous findings. Review of the MIP images confirms the above findings. IMPRESSION: 1. Negative examination for pulmonary embolism. 2. Diffuse bilateral bronchial wall thickening and scattered bronchial plugging, consistent with nonspecific infectious or inflammatory bronchitis. 3. Stable, definitively benign pulmonary nodule of the right lung base, for which no further follow-up or characterization is required. Aortic Atherosclerosis (ICD10-I70.0). Electronically Signed   By: Eddie Candle M.D.   On: 03/25/2020 17:18    Procedures Procedures   Medications Ordered in ED Medications  sodium chloride 0.9 % bolus 500 mL (has no administration in time range)  HYDROmorphone (DILAUDID) injection 2 mg (has no administration in time range)    ED Course  I have reviewed the triage vital signs and the nursing notes.  Pertinent labs & imaging results that were available during my care of  the patient were reviewed by me and considered in my medical decision making (see chart for details).  Clinical Course as of 03/25/20 2146  Sat Mar 25, 2020  2143 I spoke with Dr. Ara Kussmaul. [AW]    Clinical Course User Index [AW] Arnaldo Natal, MD   MDM Rules/Calculators/A&P                          The patient is 70 years old with a history of hypertension and chronic pain.  He presents with chest pain for months as well as his chronic low back pain.  He also endorsed headache.  His most recent medical records which were from 2020 detail a history of possible multiple myeloma, and I did entertain a diagnosis of multiple myeloma.  Also evaluated him for infection, PE, and ACS.  His delta troponin was quite elevated, and according to the heart pathway, he meets criteria for admission.  His blood pressure did remain elevated as well, and I think it is possible that he is suffering from hypertensive urgency/emergency.  I do think his back pain is related to his chronic back pain, and he is requesting his gabapentin at discharge and is aware that his methadone will need to be prescribed by a practitioner with the appropriate license for this medication.   Final Clinical Impression(s) / ED Diagnoses Final diagnoses:  Nonspecific chest pain  Hypertensive urgency  Chronic bilateral low back pain without sciatica    Rx / DC Orders ED Discharge Orders    None       Arnaldo Natal, MD 03/25/20 2147

## 2020-03-26 ENCOUNTER — Inpatient Hospital Stay (HOSPITAL_COMMUNITY): Payer: Medicare Other

## 2020-03-26 ENCOUNTER — Encounter (HOSPITAL_COMMUNITY): Payer: Self-pay | Admitting: Family Medicine

## 2020-03-26 DIAGNOSIS — I351 Nonrheumatic aortic (valve) insufficiency: Secondary | ICD-10-CM | POA: Diagnosis not present

## 2020-03-26 DIAGNOSIS — R9431 Abnormal electrocardiogram [ECG] [EKG]: Secondary | ICD-10-CM

## 2020-03-26 DIAGNOSIS — I10 Essential (primary) hypertension: Secondary | ICD-10-CM

## 2020-03-26 LAB — BASIC METABOLIC PANEL
Anion gap: 9 (ref 5–15)
BUN: 18 mg/dL (ref 8–23)
CO2: 23 mmol/L (ref 22–32)
Calcium: 9.1 mg/dL (ref 8.9–10.3)
Chloride: 105 mmol/L (ref 98–111)
Creatinine, Ser: 1.43 mg/dL — ABNORMAL HIGH (ref 0.61–1.24)
GFR, Estimated: 53 mL/min — ABNORMAL LOW (ref >60)
Glucose, Bld: 102 mg/dL — ABNORMAL HIGH (ref 70–99)
Potassium: 3.9 mmol/L (ref 3.5–5.1)
Sodium: 137 mmol/L (ref 135–145)

## 2020-03-26 LAB — ECHOCARDIOGRAM COMPLETE
Area-P 1/2: 3.6 cm2
Calc EF: 39.6 %
Height: 68 in
S' Lateral: 4.5 cm
Single Plane A2C EF: 43.9 %
Single Plane A4C EF: 35.2 %
Weight: 4320 oz

## 2020-03-26 LAB — LIPID PANEL
Cholesterol: 196 mg/dL (ref 0–200)
HDL: 37 mg/dL — ABNORMAL LOW (ref >40)
LDL Cholesterol: 144 mg/dL — ABNORMAL HIGH (ref 0–99)
Total CHOL/HDL Ratio: 5.3 RATIO
Triglycerides: 73 mg/dL (ref <150)
VLDL: 15 mg/dL (ref 0–40)

## 2020-03-26 LAB — HIV ANTIBODY (ROUTINE TESTING W REFLEX): HIV Screen 4th Generation wRfx: NONREACTIVE

## 2020-03-26 LAB — TSH: TSH: 5.009 u[IU]/mL — ABNORMAL HIGH (ref 0.350–4.500)

## 2020-03-26 LAB — SARS CORONAVIRUS 2 (TAT 6-24 HRS): SARS Coronavirus 2: NEGATIVE

## 2020-03-26 LAB — TROPONIN I (HIGH SENSITIVITY): Troponin I (High Sensitivity): 25 ng/L — ABNORMAL HIGH (ref <18)

## 2020-03-26 MED ORDER — TRAMADOL HCL 50 MG PO TABS
50.0000 mg | ORAL_TABLET | Freq: Once | ORAL | Status: AC
  Administered 2020-03-26: 50 mg via ORAL
  Filled 2020-03-26: qty 1

## 2020-03-26 MED ORDER — HYDROMORPHONE HCL 1 MG/ML IJ SOLN
1.0000 mg | Freq: Four times a day (QID) | INTRAMUSCULAR | Status: DC | PRN
  Administered 2020-03-26 – 2020-03-28 (×5): 1 mg via INTRAVENOUS
  Filled 2020-03-26 (×5): qty 1

## 2020-03-26 MED ORDER — HYDROMORPHONE HCL 1 MG/ML IJ SOLN
2.0000 mg | Freq: Once | INTRAMUSCULAR | Status: AC
Start: 2020-03-26 — End: 2020-03-26
  Administered 2020-03-26: 2 mg via INTRAVENOUS
  Filled 2020-03-26: qty 2

## 2020-03-26 MED ORDER — HYDROMORPHONE HCL 1 MG/ML IJ SOLN
2.0000 mg | Freq: Once | INTRAMUSCULAR | Status: AC
  Administered 2020-03-26: 2 mg via INTRAVENOUS
  Filled 2020-03-26: qty 2

## 2020-03-26 NOTE — Progress Notes (Signed)
  Echocardiogram 2D Echocardiogram has been performed.  Ethan Dixon 03/26/2020, 12:10 PM

## 2020-03-26 NOTE — Progress Notes (Signed)
PROGRESS NOTE  Ethan Dixon  DOB: 07-May-1950  PCP: Patient, No Pcp Per [id number]  DOA: 03/25/2020  LOS: 1 day   Chief Complaint  Patient presents with  . Chest Pain   Brief narrative: Ethan Dixon is a 70 y.o. male with PMH significant for renal cell carcinoma, CKD, HTN, BPH, chronic pain. Patient presented to the ED on 3/12 with complaint of chest pain and back pain. Patient was in a usual state of health until 3 months ago when he lost his relationship with his doctor from home he was getting prescriptions for methadone and gabapentin.  He was taking methadone for chronic back pain and gabapentin for neuropathic right leg.  Patient tapered himself off the medicine and started rationing.  But for last 1 week prior to presentation, patient has completely ran out of her medicine.  Since then, his symptoms have worsened which include weakness, headache, fatigue, chest pain, dyspnea on exertion for the past several months.  In the ED, patient was afebrile, blood pressure significantly elevated to 201/100, breathing room air CBC unremarkable, BMP with potassium low at 3.4, creatinine elevated to 1.58, troponin slightly elevated to 29 EKG with heart rate at 100 bpm, QTC prolonged to 528 ms. Chest x-ray showed mild left pleural effusion Lumbar x-ray showed degenerative changes CT head was unremarkable. CT angio of chest showed diffuse bilateral bronchial wall thickening and scattered bronchial plugging, consistent with nonspecific infectious or inflammatory bronchitis. Patient was admitted to hospitalist service  Subjective: Patient was seen and examined this morning.  Elderly African-American male.  Lying down in bed.  Not in distress. Chart reviewed Blood pressure remains elevated but less than before.  Assessment/Plan: Intractable pain -After running out of methadone and gabapentin. -Currently on methadone 10 mg daily, gabapentin 300 mg daily and on as needed  IV Dilaudid  Chest pain Slightly elevated troponin -probably due to uncontrolled hypertension. Troponin down to normal now. Obtain echo to look for any wall motion abnormality.  Hypertensive urgency -Blood pressure over 528 systolic on admission. -Home meds resumed: Metoprolol, Lasix, Maxzide  Hypokalemia -Oral replacement given Recent Labs  Lab 03/25/20 1419  K 3.4*   CKD stage IIIb -Creatinine stable. Recent Labs    03/25/20 1419  BUN 18  CREATININE 1.58*   BPH -Continue Flomax   Mobility: Encourage ambulation Code Status:   Code Status: Full Code  Nutritional status: Body mass index is 41.05 kg/m.     Diet Order            Diet Heart Room service appropriate? Yes; Fluid consistency: Thin  Diet effective now                 DVT prophylaxis: enoxaparin (LOVENOX) injection 40 mg Start: 03/25/20 2330   Antimicrobials:  None Fluid: None Consultants: None Family Communication:  None at bedside  Status is: Inpatient  Remains inpatient appropriate because -requires inpatient management for pain control.  Pending echocardiogram heart  Dispo: The patient is from: Home               Anticipated d/c is to: Home 1 to 2 days              Patient currently is not medically stable to d/c.   Difficult to place patient No       Infusions:    Scheduled Meds: . amLODipine  5 mg Oral Daily  . aspirin  81 mg Oral Daily  . atorvastatin  40 mg Oral  Daily  . enoxaparin (LOVENOX) injection  40 mg Subcutaneous Q24H  . gabapentin  300 mg Oral Daily  . methadone  10 mg Oral Daily  . metoprolol tartrate  50 mg Oral BID  . nitroGLYCERIN  0.5 inch Topical Q6H  . tamsulosin  0.4 mg Oral Daily  . triamterene-hydrochlorothiazide  1 tablet Oral q morning    Antimicrobials: Anti-infectives (From admission, onward)   None      PRN meds: acetaminophen, ondansetron (ZOFRAN) IV   Objective: Vitals:   03/26/20 0134 03/26/20 0551  BP: (!) 178/103 (!) 157/86   Pulse: 94 76  Resp: 20 20  Temp:  98.4 F (36.9 C)  SpO2: 97% 98%    Intake/Output Summary (Last 24 hours) at 03/26/2020 0820 Last data filed at 03/25/2020 1654 Gross per 24 hour  Intake 500 ml  Output --  Net 500 ml   Filed Weights   03/25/20 1344  Weight: 122.5 kg   Weight change:  Body mass index is 41.05 kg/m.   Physical Exam: General exam: Pleasant, elderly African-American male.  Not in distress at rest Skin: No rashes, lesions or ulcers. HEENT: Atraumatic, normocephalic, no obvious bleeding Lungs: Clear to auscultation bilaterally CVS: Regular rate and rhythm, no murmur GI/Abd soft, nontender, nondistended, bowels are present CNS: Alert, awake, oriented x3 Psychiatry: Mood appropriate Extremities: No pedal edema, no calf tenderness  Data Review: I have personally reviewed the laboratory data and studies available.  Recent Labs  Lab 03/25/20 1419  WBC 10.4  NEUTROABS 7.2  HGB 13.4  HCT 42.8  MCV 84.6  PLT 263   Recent Labs  Lab 03/25/20 1419  NA 140  K 3.4*  CL 105  CO2 28  GLUCOSE 99  BUN 18  CREATININE 1.58*  CALCIUM 9.0    F/u labs ordered Unresulted Labs (From admission, onward)          Start     Ordered   04/01/20 0500  Creatinine, serum  (enoxaparin (LOVENOX)    CrCl >/= 30 ml/min)  Weekly,   R     Comments: while on enoxaparin therapy    03/25/20 2320   03/26/20 7062  Basic metabolic panel  ONCE - STAT,   STAT        03/26/20 0756   03/25/20 2323  SARS CORONAVIRUS 2 (TAT 6-24 HRS) Nasopharyngeal Nasopharyngeal Swab  (Tier 3 - Symptomatic/asymptomatic with Precautions)  Once,   STAT       Question Answer Comment  Is this test for diagnosis or screening Screening   Symptomatic for COVID-19 as defined by CDC No   Hospitalized for COVID-19 No   Admitted to ICU for COVID-19 No   Previously tested for COVID-19 No   Resident in a congregate (group) care setting No   Employed in healthcare setting No   Has patient completed COVID  vaccination(s) (2 doses of Pfizer/Moderna 1 dose of [employer]) No      03/25/20 2322   03/25/20 2321  HIV Antibody (routine testing w rflx)  (HIV Antibody (Routine testing w reflex) panel)  Once,   STAT        03/25/20 2320   03/25/20 2321  Lipid panel  Once,   STAT        03/25/20 2320   03/25/20 2321  TSH  Once,   STAT        03/25/20 2320          Signed, Terrilee Croak, MD [facility] 03/26/2020

## 2020-03-27 MED ORDER — METHADONE HCL 10 MG PO TABS
40.0000 mg | ORAL_TABLET | Freq: Every day | ORAL | Status: DC
  Administered 2020-03-28: 40 mg via ORAL
  Filled 2020-03-27: qty 4

## 2020-03-27 MED ORDER — HYDROMORPHONE HCL 1 MG/ML IJ SOLN
1.0000 mg | Freq: Once | INTRAMUSCULAR | Status: AC
  Administered 2020-03-27: 1 mg via INTRAVENOUS
  Filled 2020-03-27: qty 1

## 2020-03-27 MED ORDER — GABAPENTIN 300 MG PO CAPS
300.0000 mg | ORAL_CAPSULE | Freq: Two times a day (BID) | ORAL | Status: DC
  Administered 2020-03-27 – 2020-03-28 (×2): 300 mg via ORAL
  Filled 2020-03-27 (×2): qty 1

## 2020-03-27 NOTE — TOC Progression Note (Addendum)
Transition of Care [facility]) - Progression Note    Patient Details  Name: Deric Bocock MRN: [id number] Date of Birth: 1950/02/15  [facility] CM/SW Contact  Purcell Mouton, RN Phone Number: 03/27/2020, 4:09 PM  Clinical Narrative:     Spoke with pt several times today concerning discharge plans for PCP and Methadone Clinic. Appointment for PCP was made for pt at [facility] for Wednesday 3/16 at [place]. Pt will need to call for an appointment at [facility] for [facility]. Information placed on discharge follow up for pt. Pt asked CM to call his wife with information. A call to pt's wife was made, however there was no answer and VM was full.  Explained to pt.        Expected Discharge Plan and Services                                                 Social Determinants of Health (SDOH) Interventions    Readmission Risk Interventions No flowsheet data found.

## 2020-03-27 NOTE — Progress Notes (Signed)
PROGRESS NOTE  Ethan Dixon  DOB: January 02, 1951  PCP: Patient, No Pcp Per [id number]  DOA: 03/25/2020  LOS: 2 days   Chief Complaint  Patient presents with  . Chest Pain   Brief narrative: Ethan Dixon is a 70 y.o. male with PMH significant for renal cell carcinoma, CKD, HTN, BPH, chronic pain. Patient presented to the ED on 3/12 with complaint of chest pain and back pain. Patient was in a usual state of health until 3 months ago when he lost his relationship with his doctor from home he was getting prescriptions for methadone and gabapentin.  He was taking methadone for chronic back pain and gabapentin for neuropathic right leg.  Patient tapered himself off the medicine and started rationing.  But for last 1 week prior to presentation, patient has completely ran out of her medicine.  Since then, his symptoms have worsened which include weakness, headache, fatigue, chest pain, dyspnea on exertion for the past several months.  In the ED, patient was afebrile, blood pressure significantly elevated to 201/100, breathing room air CBC unremarkable, BMP with potassium low at 3.4, creatinine elevated to 1.58, troponin slightly elevated to 29 EKG with heart rate at 100 bpm, QTC prolonged to 528 ms. Chest x-ray showed mild left pleural effusion Lumbar x-ray showed degenerative changes CT head was unremarkable. CT angio of chest showed diffuse bilateral bronchial wall thickening and scattered bronchial plugging, consistent with nonspecific infectious or inflammatory bronchitis. Patient was admitted to hospitalist service  Subjective: Patient was seen and examined this morning.   Patient complains of 10 out of 10 headache all night long.  It is global and deep-seated.  Partial response to as needed IV Dilaudid.  Heart rate remained stable.  Blood pressure slightly elevated  Assessment/Plan: Intractable headache -After running out of methadone and gabapentin. -Patient  states that he was taking methadone 40 mg daily and gabapentin 300 mg twice daily.  Currently on small doses.  I increased him to his home dose. -Continue as needed Dilaudid for now.  Chest pain Slightly elevated troponin -probably due to uncontrolled hypertension. Troponin down to normal now.  Echocardiogram showed EF of 35 to 40% with global hypokinesis, moderate LVH and grade 1 diastolic dysfunction.  No evidence of heart failure.  Hypertensive urgency -Blood pressure over 465 systolic on admission. -Home meds resumed: Metoprolol, Lasix, Maxzide -Blood pressure is stabilizing now.  Hypokalemia -Improved with oral replacement. Recent Labs  Lab 03/25/20 1419 03/26/20 0938  K 3.4* 3.9   CKD stage IIIb -Creatinine stable. Recent Labs    03/25/20 1419 03/26/20 0938  BUN 18 18  CREATININE 1.58* 1.43*   BPH -Continue Flomax   Mobility: Encourage ambulation Code Status:   Code Status: Full Code  Nutritional status: Body mass index is 41.05 kg/m.     Diet Order            Diet Heart Room service appropriate? Yes; Fluid consistency: Thin  Diet effective now                 DVT prophylaxis: enoxaparin (LOVENOX) injection 40 mg Start: 03/25/20 2330   Antimicrobials:  None Fluid: None Consultants: None Family Communication:  None at bedside  Status is: Inpatient  Remains inpatient appropriate because - requires inpatient management for pain control.  Dispo: The patient is from: Home               Anticipated d/c is to: Home 1 to 2 days  Patient currently is not medically stable to d/c.   Difficult to place patient No    Infusions:    Scheduled Meds: . amLODipine  5 mg Oral Daily  . aspirin  81 mg Oral Daily  . atorvastatin  40 mg Oral Daily  . enoxaparin (LOVENOX) injection  40 mg Subcutaneous Q24H  . gabapentin  300 mg Oral BID  . [START ON 03/28/2020] methadone  40 mg Oral Daily  . metoprolol tartrate  50 mg Oral BID  . nitroGLYCERIN   0.5 inch Topical Q6H  . tamsulosin  0.4 mg Oral Daily  . triamterene-hydrochlorothiazide  1 tablet Oral q morning    Antimicrobials: Anti-infectives (From admission, onward)   None      PRN meds: acetaminophen, HYDROmorphone (DILAUDID) injection, ondansetron (ZOFRAN) IV   Objective: Vitals:   03/27/20 0447 03/27/20 0855  BP: (!) 153/91 (!) 168/85  Pulse: 63 62  Resp: 20   Temp: 98.2 F (36.8 C)   SpO2: 100%    No intake or output data in the 24 hours ending 03/27/20 1356 Filed Weights   03/25/20 1344  Weight: 122.5 kg   Weight change:  Body mass index is 41.05 kg/m.   Physical Exam: General exam: Pleasant, elderly African-American male.  Stressed because of severe headache Skin: No rashes, lesions or ulcers. HEENT: Atraumatic, normocephalic, no obvious bleeding Lungs: Clear to auscultation bilaterally CVS: Regular rate and rhythm, no murmur GI/Abd soft, nontender, nondistended, bowels are present CNS: Alert, awake, oriented x3 Psychiatry: Anxious, stressed Extremities: No pedal edema, no calf tenderness  Data Review: I have personally reviewed the laboratory data and studies available.  Recent Labs  Lab 03/25/20 1419  WBC 10.4  NEUTROABS 7.2  HGB 13.4  HCT 42.8  MCV 84.6  PLT 263   Recent Labs  Lab 03/25/20 1419 03/26/20 0938  NA 140 137  K 3.4* 3.9  CL 105 105  CO2 28 23  GLUCOSE 99 102*  BUN 18 18  CREATININE 1.58* 1.43*  CALCIUM 9.0 9.1    F/u labs ordered Unresulted Labs (From admission, onward)          Start     Ordered   04/01/20 0500  Creatinine, serum  (enoxaparin (LOVENOX)    CrCl >/= 30 ml/min)  Weekly,   R     Comments: while on enoxaparin therapy    03/25/20 2320          Signed, Terrilee Croak, MD [facility] 03/27/2020

## 2020-03-28 MED ORDER — TAMSULOSIN HCL 0.4 MG PO CAPS: 0.4000 mg | ORAL_CAPSULE | Freq: Every day | ORAL | 2 refills | Status: AC

## 2020-03-28 MED ORDER — AMLODIPINE BESYLATE 5 MG PO TABS: 5.0000 mg | ORAL_TABLET | Freq: Every day | ORAL | 2 refills | Status: AC

## 2020-03-28 MED ORDER — GABAPENTIN 300 MG PO CAPS: 300.0000 mg | ORAL_CAPSULE | Freq: Two times a day (BID) | ORAL | 2 refills | Status: DC

## 2020-03-28 MED ORDER — METOPROLOL TARTRATE 50 MG PO TABS: 50.0000 mg | ORAL_TABLET | Freq: Two times a day (BID) | ORAL | 2 refills | Status: AC

## 2020-03-28 MED ORDER — TRIAMTERENE-HCTZ 37.5-25 MG PO TABS: 1.0000 | ORAL_TABLET | Freq: Every morning | ORAL | 2 refills | Status: AC

## 2020-03-28 MED ORDER — OXYCODONE-ACETAMINOPHEN 5-325 MG PO TABS: 1.0000 | ORAL_TABLET | Freq: Four times a day (QID) | ORAL | 0 refills | Status: DC | PRN

## 2020-03-28 NOTE — Discharge Summary (Signed)
PROGRESS NOTE  Ethan Dixon  DOB: 07/03/1950  PCP: Ethan Dixon, Ethan Dixon:[id number]  DOA: 03/25/2020  LOS: 3 days   Chief Complaint  Ethan Dixon presents with  . Chest Pain   Brief narrative: Ethan Dixon is a 70 y.o. male with PMH significant for renal cell carcinoma, CKD, HTN, BPH, chronic pain. Ethan Dixon presented to the ED on 3/12 with complaint of chest pain and back pain. Ethan Dixon was in a usual state of health until 3 months ago when he lost his relationship with his doctor from home he was getting prescriptions for methadone and gabapentin.  He was taking methadone for chronic back pain and gabapentin for neuropathic right leg.  Ethan Dixon tapered himself off the medicine and started rationing.  But for last 1 week prior to presentation, Ethan Dixon has completely ran out of her medicine.  Since then, his symptoms have worsened which include weakness, headache, fatigue, chest pain, dyspnea on exertion for the past several months.  In the ED, Ethan Dixon was afebrile, blood pressure significantly elevated to 201/100, breathing room air CBC unremarkable, BMP with potassium low at 3.4, creatinine elevated to 1.58, troponin slightly elevated to 29 EKG with heart rate at 100 bpm, QTC prolonged to 528 ms. Chest x-ray showed mild left pleural effusion Lumbar x-ray showed degenerative changes CT head was unremarkable. CT angio of chest showed diffuse bilateral bronchial wall thickening and scattered bronchial plugging, consistent with nonspecific infectious or inflammatory bronchitis. Ethan Dixon was admitted to hospitalist service.  Subjective: Ethan Dixon was seen and examined this morning.  Lying on bed.  Not in distress.  Feels better compared to yesterday.  Less episodes of headache.  Assessment/Plan: Intractable headache -headache started after he ran out of methadone and gabapentin.  Partly secondary to uncontrolled blood pressure as well. -Ethan Dixon states that he was taking  methadone 40 mg daily and gabapentin 300 mg twice daily.  The same dose was resumed in the hospital.  He was also given IV Dilaudid as needed in the hospital.  We will discharge him with a prescription for gabapentin 300 mg twice daily.  Ethan Dixon is to follow-up with PCP and methadone clinic as an outpatient for prescription of methadone. For now I will discharge him on a week supply of Percocet. -He has an appointment tomorrow 3/16 [facility].  He will also need to make a call for an appointment at the methadone clinic.  Chest pain Slightly elevated troponin New systolic CHF -Presented with chest pain, troponin was slightly elevated returned back to normal.   Echocardiogram showed EF of 35 to 40% with global hypokinesis, moderate LVH and grade 1 diastolic dysfunction.  Ethan regional wall motion abnormality.  Clinically Ethan evidence of congestive heart failure.  Ethan Dixon states he has never seen a cardiologist in the past.  I ordered for referral to cardiology as an outpatient.  Hypertensive urgency -Blood pressure over 811 systolic on admission.  Partly responsible for his headache. -Home meds resumed: Metoprolol, Lasix, Maxzide -Blood pressure stabilizing after that.  Hypokalemia -Improved with oral replacement. Recent Labs  Lab 03/25/20 1419 03/26/20 0938  K 3.4* 3.9   CKD stage IIIb -Creatinine stable. Recent Labs    03/25/20 1419 03/26/20 0938  BUN 18 18  CREATININE 1.58* 1.43*   BPH -Continue Flomax   Wound care:    Discharge Exam:   Vitals:   03/27/20 1410 03/27/20 2021 03/28/20 0452 03/28/20 1004  BP: 139/83 (!) 142/78 (!) 157/88 135/61  Pulse: 67 67 69  60  Resp: (!) $RemoveB'22 18 18   'OnBGwBZV$ Temp: 97.9 F (36.6 C) 98.3 F (36.8 C) 97.8 F (36.6 C)   TempSrc: Oral Oral Oral   SpO2: 98% 99% 98% 99%  Weight:      Height:        Body mass index is 41.05 kg/m.  General exam: Pleasant, elderly African-American male.  Not in distress Skin: Ethan rashes,  lesions or ulcers. HEENT: Atraumatic, normocephalic, Ethan obvious bleeding Lungs: Clear to auscultation bilaterally CVS: Regular rate and rhythm, Ethan murmur GI/Abd soft, nontender, nondistended, bowel sound present CNS: Alert, awake, oriented x3 Psychiatry: Mood appropriate Extremities: Ethan pedal edema, Ethan calf tenderness  Follow ups:   Discharge Instructions    Diet - low sodium heart healthy   Complete by: As directed    Increase activity slowly   Complete by: As directed       Follow-up [facility] Follow up on 03/29/2020.   Specialty: Internal Medicine Why: Your appointment is 3/16 Wednesday at  1:00 PM try to arrive early. Please keep this appointment for a primary care doctor.  Contact information: [place] [id number] [place] [phone]       [facility] Follow up.   Why: Please call to make an appointment [phone]. Contact information: [address] [place], [place]              Recommendations for Outpatient Follow-Up:   1. Follow-up with PCP as an outpatient 2. Follow-up with cardiology as an outpatient.  Referral given  Discharge Instructions:  Follow with Primary MD Ethan Dixon, Ethan Pcp Per in 7 days   Get CBC/BMP checked in next visit within 1 week by PCP or SNF MD ( we routinely change or add medications that can affect your baseline labs and fluid status, therefore we recommend that you get the mentioned basic workup next visit with your PCP, your PCP may decide not to get them or add new tests based on their clinical decision)  On your next visit with your PCP, please Get Medicines reviewed and adjusted.  Please request your PCP  to go over all Hospital Tests and Procedure/Radiological results at the follow up, please get all Hospital records sent to your Prim MD by signing hospital release before you go home.  Activity: As tolerated with  Full fall precautions use walker/cane & assistance as needed  For Heart failure patients - Check your Weight same time everyday, if you gain over 2 pounds, or you develop in leg swelling, experience more shortness of breath or chest pain, call your Primary MD immediately. Follow Cardiac Low Salt Diet and 1.5 lit/day fluid restriction.  If you have smoked or chewed Tobacco in the last 2 yrs please stop smoking, stop any regular Alcohol  and or any Recreational drug use.  If you experience worsening of your admission symptoms, develop shortness of breath, life threatening emergency, suicidal or homicidal thoughts you must seek medical attention immediately by calling 911 or calling your MD immediately  if symptoms less severe.  You Must read complete instructions/literature along with all the possible adverse reactions/side effects for all the Medicines you take and that have been prescribed to you. Take any new Medicines after you have completely understood and accpet all the possible adverse reactions/side effects.   Do not drive, operate heavy machinery, perform activities at heights, swimming or participation in water activities or provide  baby sitting services if your were admitted for syncope or siezures until you have seen by Primary MD or a Neurologist and advised to do so again.  Do not drive when taking Pain medications.  Do not take more than prescribed Pain, Sleep and Anxiety Medications  Wear Seat belts while driving.   Please note You were cared for by a hospitalist during your hospital stay. If you have any questions about your discharge medications or the care you received while you were in the hospital after you are discharged, you can call the unit and asked to speak with the hospitalist on call if the hospitalist that took care of you is not available. Once you are discharged, your primary care physician will handle any further medical issues. Please note that Ethan REFILLS for any  discharge medications will be authorized once you are discharged, as it is imperative that you return to your primary care physician (or establish a relationship with a primary care physician if you do not have one) for your aftercare needs so that they can reassess your need for medications and monitor your lab values.    Allergies as of 03/28/2020   Ethan Known Allergies     Medication List    TAKE these medications   amLODipine 5 MG tablet Commonly known as: NORVASC Take 1 tablet (5 mg total) by mouth daily.   gabapentin 300 MG capsule Commonly known as: NEURONTIN Take 1 capsule (300 mg total) by mouth 2 (two) times daily. What changed: when to take this   methadone 10 MG tablet Commonly known as: DOLOPHINE Take 10 mg by mouth every 12 (twelve) hours.   metoprolol tartrate 50 MG tablet Commonly known as: LOPRESSOR Take 1 tablet (50 mg total) by mouth 2 (two) times daily.   oxyCODONE-acetaminophen 5-325 MG tablet Commonly known as: Percocet Take 1 tablet by mouth every 6 (six) hours as needed for up to 5 days for severe pain.   tamsulosin 0.4 MG Caps capsule Commonly known as: FLOMAX Take 1 capsule (0.4 mg total) by mouth daily.   triamterene-hydrochlorothiazide 37.5-25 MG tablet Commonly known as: MAXZIDE-25 Take 1 tablet by mouth every morning.   Vitamin D (Ergocalciferol) 1.25 MG (50000 UNIT) Caps capsule Commonly known as: DRISDOL TAKE ONE CAPSULE BY MOUTH TWICE PER WEEK       Time coordinating discharge: 35 minutes  The results of significant diagnostics from this hospitalization (including imaging, microbiology, ancillary and laboratory) are listed below for reference.    Procedures and Diagnostic Studies:   DG Chest 2 View  Result Date: 03/25/2020 CLINICAL DATA:  Chest pain.  History of multiple myeloma EXAM: CHEST - 2 VIEW COMPARISON:  PET-CT September 26, 2017 FINDINGS: There is a small left pleural effusion. The lungs elsewhere are clear. Heart size and  pulmonary vascularity are normal. Ethan blastic or lytic bone lesions. Bony overgrowth along the lateral left clavicle noted. IMPRESSION: Small left pleural effusion. Ethan edema or airspace opacity. Heart size normal. Ethan findings suggestive of multiple myeloma by radiographic examination. Electronically Signed   By: Bretta Bang III M.D.   On: 03/25/2020 15:00   DG Lumbar Spine Complete  Result Date: 03/25/2020 CLINICAL DATA:  Evaluate for possible multiple myeloma.  Back pain. EXAM: LUMBAR SPINE - COMPLETE 4+ VIEW COMPARISON:  None. FINDINGS: Degenerative disc disease in the lumbar spine. There is a focus of low attenuation of the superior aspect of T12. Ethan other bony lesions are identified. Ethan fracture or traumatic malalignment. Ethan  other acute abnormalities. IMPRESSION: 1. There is a small focus of low attenuation of the superior aspect of T12 which is nonspecific. This could represent artifact versus a small lucent lesion. 2. Degenerative changes. 3. Ethan other abnormalities. Electronically Signed   By: Dorise Bullion III M.D   On: 03/25/2020 15:04   CT Head Wo Contrast  Result Date: 03/25/2020 CLINICAL DATA:  Headache, uncontrolled hypertension EXAM: CT HEAD WITHOUT CONTRAST TECHNIQUE: Contiguous axial images were obtained from the base of the skull through the vertex without intravenous contrast. COMPARISON:  09/30/2017 FINDINGS: Brain: Ethan evidence of acute infarction, hemorrhage, hydrocephalus, extra-axial collection or mass lesion/mass effect. Nonspecific subtle hyperdensities within the bilateral basal ganglia are unchanged from prior. Vascular: Ethan hyperdense vessel or unexpected calcification. Skull: Normal. Negative for fracture or focal lesion. Sinuses/Orbits: Ethan acute finding. Other: None. IMPRESSION: Ethan acute intracranial findings. Electronically Signed   By: Davina Poke D.O.   On: 03/25/2020 15:10   CT Angio Chest PE W/Cm &/Or Wo Cm  Result Date: 03/25/2020 CLINICAL DATA:  Chest pain,  shortness of breath EXAM: CT ANGIOGRAPHY CHEST WITH CONTRAST TECHNIQUE: Multidetector CT imaging of the chest was performed using the standard protocol during bolus administration of intravenous contrast. Multiplanar CT image reconstructions and MIPs were obtained to evaluate the vascular anatomy. CONTRAST:  4mL OMNIPAQUE IOHEXOL 350 MG/ML SOLN COMPARISON:  PET-CT, 09/26/2017 FINDINGS: Cardiovascular: Satisfactory opacification of the pulmonary arteries to the segmental level. Ethan evidence of pulmonary embolism. Normal heart size. Ethan pericardial effusion. Scattered aortic atherosclerosis. Mediastinum/Nodes: Ethan enlarged mediastinal, hilar, or axillary lymph nodes. Thyroid gland, trachea, and esophagus demonstrate Ethan significant findings. Lungs/Pleura: Diffuse bilateral bronchial wall thickening and scattered bronchial plugging. 9 mm subpleural pulmonary nodule at the right lung base, stable compared to prior PET-CT dated 09/26/2017 and definitively benign (series 6, image 103) Ethan pleural effusion or pneumothorax. Upper Abdomen: Ethan acute abnormality. Musculoskeletal: Ethan chest wall abnormality. Ethan acute or significant osseous findings. Review of the MIP images confirms the above findings. IMPRESSION: 1. Negative examination for pulmonary embolism. 2. Diffuse bilateral bronchial wall thickening and scattered bronchial plugging, consistent with nonspecific infectious or inflammatory bronchitis. 3. Stable, definitively benign pulmonary nodule of the right lung base, for which Ethan further follow-up or characterization is required. Aortic Atherosclerosis (ICD10-I70.0). Electronically Signed   By: Eddie Candle M.D.   On: 03/25/2020 17:18   ECHOCARDIOGRAM COMPLETE  Result Date: 03/26/2020    ECHOCARDIOGRAM REPORT   Ethan Dixon Name:   [facility] Date of Exam: 03/26/2020 Medical Rec #:  [id number]                Height:       68.0 in Accession #:    [id number]               Weight:       270.0 lb Date of Birth:   11/13/1950                 BSA:          2.322 m Ethan Dixon Age:    66 years                 BP:           188/107 mmHg Ethan Dixon Gender: M                        HR:           59 bpm. Exam Location:  Inpatient Procedure: 2D Echo, 3D Echo, Color Doppler, Cardiac Doppler and Strain Analysis Indications:    R94.31 Abnormal EKG  History:        Ethan Dixon has Ethan prior history of Echocardiogram examinations.                 Signs/Symptoms:Chest Pain; Risk Factors:Hypertension. Cancer.                 Severe hypertension.  Sonographer:    Roseanna Rainbow RDCS Referring Phys: [id number] [facility]  Sonographer Comments: Technically difficult study due to poor echo windows and Ethan Dixon is morbidly obese. Image acquisition challenging due to Ethan Dixon body habitus. Global longitudinal strain was attempted. IMPRESSIONS  1. Left ventricular ejection fraction, by estimation, is 35 to 40%. The left ventricle has moderately decreased function. The left ventricle demonstrates global hypokinesis. There is moderate concentric left ventricular hypertrophy. Left ventricular diastolic parameters are consistent with Grade I diastolic dysfunction (impaired relaxation). Elevated left ventricular end-diastolic pressure. The average left ventricular global longitudinal strain is -11.7 %. The global longitudinal strain is abnormal.  2. Right ventricular systolic function is normal. The right ventricular size is normal. There is normal pulmonary artery systolic pressure.  3. Left atrial size was moderately dilated.  4. The mitral valve is normal in structure. Trivial mitral valve regurgitation. Ethan evidence of mitral stenosis.  5. The aortic valve is calcified. There is mild calcification of the aortic valve. There is mild thickening of the aortic valve. Aortic valve regurgitation is mild. Mild aortic valve sclerosis is present, with Ethan evidence of aortic valve stenosis.  6. Aortic dilatation noted. There is mild dilatation of the ascending aorta, measuring 42  mm.  7. The inferior vena cava is normal in size with greater than 50% respiratory variability, suggesting right atrial pressure of 3 mmHg. FINDINGS  Left Ventricle: Left ventricular ejection fraction, by estimation, is 35 to 40%. The left ventricle has moderately decreased function. The left ventricle demonstrates global hypokinesis. The average left ventricular global longitudinal strain is -11.7 %. The global longitudinal strain is abnormal. The left ventricular internal cavity size was normal in size. There is moderate concentric left ventricular hypertrophy. Left ventricular diastolic parameters are consistent with Grade I diastolic dysfunction (impaired relaxation). Elevated left ventricular end-diastolic pressure. Right Ventricle: The right ventricular size is normal. Ethan increase in right ventricular wall thickness. Right ventricular systolic function is normal. There is normal pulmonary artery systolic pressure. The tricuspid regurgitant velocity is 2.27 m/s, and  with an assumed right atrial pressure of 3 mmHg, the estimated right ventricular systolic pressure is 96.2 mmHg. Left Atrium: Left atrial size was moderately dilated. Right Atrium: Right atrial size was normal in size. Pericardium: Trivial pericardial effusion is present. Mitral Valve: The mitral valve is normal in structure. Trivial mitral valve regurgitation. Ethan evidence of mitral valve stenosis. Tricuspid Valve: The tricuspid valve is normal in structure. Tricuspid valve regurgitation is trivial. Ethan evidence of tricuspid stenosis. Aortic Valve: The aortic valve is calcified. There is mild calcification of the aortic valve. There is mild thickening of the aortic valve. Aortic valve regurgitation is mild. Mild aortic valve sclerosis is present, with Ethan evidence of aortic valve stenosis. Pulmonic Valve: The pulmonic valve was normal in structure. Pulmonic valve regurgitation is not visualized. Ethan evidence of pulmonic stenosis. Aorta: Aortic  dilatation noted. There is mild dilatation of the ascending aorta, measuring 42 mm. Venous: The inferior vena cava is normal in size with greater than 50% respiratory variability, suggesting  right atrial pressure of 3 mmHg. IAS/Shunts: Ethan atrial level shunt detected by color flow Doppler.  LEFT VENTRICLE PLAX 2D LVIDd:         5.30 cm      Diastology LVIDs:         4.50 cm      LV e' medial:    3.05 cm/s LV PW:         1.70 cm      LV E/e' medial:  18.9 LV IVS:        1.50 cm      LV e' lateral:   3.92 cm/s LVOT diam:     2.10 cm      LV E/e' lateral: 14.7 LV SV:         88 LV SV Index:   38           2D Longitudinal Strain LVOT Area:     3.46 cm     2D Strain GLS (A2C):   -16.0 %                             2D Strain GLS (A3C):   -10.1 %                             2D Strain GLS (A4C):   -8.9 % LV Volumes (MOD)            2D Strain GLS Avg:     -11.7 % LV vol d, MOD A2C: 142.0 ml LV vol d, MOD A4C: 145.0 ml LV vol s, MOD A2C: 79.6 ml LV vol s, MOD A4C: 93.9 ml LV SV MOD A2C:     62.4 ml LV SV MOD A4C:     145.0 ml LV SV MOD BP:      57.0 ml RIGHT VENTRICLE             IVC RV S prime:     10.30 cm/s  IVC diam: 1.60 cm TAPSE (M-mode): 2.1 cm LEFT ATRIUM             Index       RIGHT ATRIUM           Index LA diam:        4.30 cm 1.85 cm/m  RA Area:     12.70 cm LA Vol (A2C):   99.9 ml 43.03 ml/m RA Volume:   27.80 ml  11.97 ml/m LA Vol (A4C):   62.4 ml 26.88 ml/m LA Biplane Vol: 83.0 ml 35.75 ml/m  AORTIC VALVE LVOT Vmax:   131.00 cm/s LVOT Vmean:  87.500 cm/s LVOT VTI:    0.255 m  AORTA Ao Root diam: 3.10 cm Ao Asc diam:  4.20 cm MITRAL VALVE                TRICUSPID VALVE MV Area (PHT): 3.60 cm     TR Peak grad:   20.6 mmHg MV Decel Time: 211 msec     TR Vmax:        227.00 cm/s MV E velocity: 57.50 cm/s MV A velocity: 112.00 cm/s  SHUNTS MV E/A ratio:  0.51         Systemic VTI:  0.26 m                             Systemic  Diam: 2.10 cm Skeet Latch MD Electronically signed by Skeet Latch MD  Signature Date/Time: 03/26/2020/12:40:53 PM    Final      Labs:   Basic Metabolic Panel: Recent Labs  Lab 03/25/20 1419 03/26/20 0938  NA 140 137  K 3.4* 3.9  CL 105 105  CO2 28 23  GLUCOSE 99 102*  BUN 18 18  CREATININE 1.58* 1.43*  CALCIUM 9.0 9.1   GFR Estimated Creatinine Clearance: 61.2 mL/min (A) (by C-G formula based on SCr of 1.43 mg/dL (H)). Liver Function Tests: Recent Labs  Lab 03/25/20 1419  AST 14*  ALT 9  ALKPHOS 111  BILITOT 1.0  PROT 7.4  ALBUMIN 3.8   Ethan results for input(s): LIPASE, AMYLASE in the last 168 hours. Ethan results for input(s): AMMONIA in the last 168 hours. Coagulation profile Ethan results for input(s): INR, PROTIME in the last 168 hours.  CBC: Recent Labs  Lab 03/25/20 1419  WBC 10.4  NEUTROABS 7.2  HGB 13.4  HCT 42.8  MCV 84.6  PLT 263   Cardiac Enzymes: Ethan results for input(s): CKTOTAL, CKMB, CKMBINDEX, TROPONINI in the last 168 hours. BNP: Invalid input(s): POCBNP CBG: Ethan results for input(s): GLUCAP in the last 168 hours. D-Dimer Recent Labs    03/25/20 1419  DDIMER 0.69*   Hgb A1c Ethan results for input(s): HGBA1C in the last 72 hours. Lipid Profile Recent Labs    03/26/20 0938  CHOL 196  HDL 37*  LDLCALC 144*  TRIG 73  CHOLHDL 5.3   Thyroid function studies Recent Labs    03/26/20 0938  TSH 5.009*   Anemia work up Ethan results for input(s): VITAMINB12, FOLATE, FERRITIN, TIBC, IRON, RETICCTPCT in the last 72 hours. Microbiology Recent Results (from the past 240 hour(s))  SARS CORONAVIRUS 2 (TAT 6-24 HRS) Nasopharyngeal Nasopharyngeal Swab     Status: None   Collection Time: 03/25/20 11:23 PM   Specimen: Nasopharyngeal Swab  Result Value Ref Range Status   SARS Coronavirus 2 NEGATIVE NEGATIVE Final    Comment: (NOTE) SARS-CoV-2 target nucleic acids are NOT DETECTED.  The SARS-CoV-2 RNA is generally detectable in upper and lower respiratory specimens during the acute phase of infection.  Negative results do not preclude SARS-CoV-2 infection, do not rule out co-infections with other pathogens, and should not be used as the sole basis for treatment or other Ethan Dixon management decisions. Negative results must be combined with clinical observations, Ethan Dixon history, and epidemiological information. The expected result is Negative.  Fact Sheet for Patients: SugarRoll.be  Fact Sheet for Healthcare Providers: https://www.woods-mathews.com/  This test is not yet approved or cleared by the [place] FDA and  has been authorized for detection and/or diagnosis of SARS-CoV-2 by FDA under an Emergency Use Authorization (EUA). This EUA will remain  in effect (meaning this test can be used) for the duration of the COVID-19 declaration under Se ction 564(b)(1) of the Act, 21 U.S.C. section 360bbb-3(b)(1), unless the authorization is terminated or revoked sooner.  Performed at [facility], [place] [address], [place],  [address]      Signed: Terrilee Croak  [facility] 03/28/2020, 10:56 AM

## 2020-03-28 NOTE — Care Management Important Message (Signed)
Important Message  Patient Details IM Letter given to the Patient. Name: Ethan Dixon MRN: [id number] Date of Birth: 1950-09-03   Medicare Important Message Given:  Yes     Kerin Salen 03/28/2020, 10:06 AM

## 2020-03-29 ENCOUNTER — Ambulatory Visit (INDEPENDENT_AMBULATORY_CARE_PROVIDER_SITE_OTHER): Payer: Medicare Other | Admitting: Family Medicine

## 2020-03-29 ENCOUNTER — Encounter: Payer: Self-pay | Admitting: Family Medicine

## 2020-03-29 ENCOUNTER — Other Ambulatory Visit: Payer: Self-pay

## 2020-03-29 VITALS — BP 144/65 | HR 71 | Ht 68.0 in | Wt 260.0 lb

## 2020-03-29 DIAGNOSIS — G8929 Other chronic pain: Secondary | ICD-10-CM

## 2020-03-29 DIAGNOSIS — N2889 Other specified disorders of kidney and ureter: Secondary | ICD-10-CM | POA: Diagnosis not present

## 2020-03-29 DIAGNOSIS — I10 Essential (primary) hypertension: Secondary | ICD-10-CM

## 2020-03-29 DIAGNOSIS — Z0000 Encounter for general adult medical examination without abnormal findings: Secondary | ICD-10-CM

## 2020-03-29 DIAGNOSIS — M5441 Lumbago with sciatica, right side: Secondary | ICD-10-CM

## 2020-03-29 DIAGNOSIS — M5440 Lumbago with sciatica, unspecified side: Secondary | ICD-10-CM

## 2020-03-29 DIAGNOSIS — F1123 Opioid dependence with withdrawal: Secondary | ICD-10-CM | POA: Diagnosis not present

## 2020-03-29 DIAGNOSIS — E538 Deficiency of other specified B group vitamins: Secondary | ICD-10-CM

## 2020-03-29 DIAGNOSIS — Z09 Encounter for follow-up examination after completed treatment for conditions other than malignant neoplasm: Secondary | ICD-10-CM

## 2020-03-29 DIAGNOSIS — F1193 Opioid use, unspecified with withdrawal: Secondary | ICD-10-CM

## 2020-03-29 DIAGNOSIS — Z7689 Persons encountering health services in other specified circumstances: Secondary | ICD-10-CM | POA: Diagnosis not present

## 2020-03-29 DIAGNOSIS — E559 Vitamin D deficiency, unspecified: Secondary | ICD-10-CM

## 2020-03-29 MED ORDER — GABAPENTIN 300 MG PO CAPS: 300.0000 mg | ORAL_CAPSULE | Freq: Three times a day (TID) | ORAL | 2 refills | Status: AC

## 2020-03-29 MED ORDER — KETOROLAC TROMETHAMINE 60 MG/2ML IM SOLN
60.0000 mg | Freq: Once | INTRAMUSCULAR | Status: AC
  Administered 2020-03-29: 60 mg via INTRAMUSCULAR

## 2020-03-29 NOTE — Progress Notes (Signed)
Patient [facility]    Established Patient Office Visit  Subjective:  Patient ID: Ethan Dixon, male    DOB: 1950-09-06  Age: 70 y.o. MRN: [id number]  CC:  Chief Complaint  Patient presents with  . Establish Care  . Hypertension   HPI Ethan Dixon is a 70 year old male who presents for Hospital Follow Up and to Establish Care.    Patient Active Problem List   Diagnosis Date Noted  . Severe uncontrolled hypertension 03/25/2020  . Cough 09/14/2016  . Left renal mass 09/14/2016  . Obesity, Class III, BMI 40-49.9 (morbid obesity) ([place]) 09/14/2016   Current Status: This will be [name] initial office visit with me. He was previously seeing Dr. Lonia Mad from [place], [place], who has lost his license and his practice, for his PCP needs. Since his last office visit, he has has a Hospital Admission for Hypertension from 03/25/2020-03/28/2020. Today, he states that he would like to have his Methadone refilled. He states that he has been on for over 20 years at approximately 300 tablets prescribed a month, with a decrease in dose of  Methadone 10 mg QID, but since his physician's practice was seized 01/2020.  He states that his last dose of Methadone was on this last Tuesday. He denies fevers, chills, fatigue, recent infections, weight loss, and night sweats. He has not had any headaches, visual changes, dizziness, and falls. No chest pain, heart palpitations, cough and shortness of breath reported. No reports of GI problems such as nausea, vomiting, diarrhea, and constipation. He has no reports of blood in stools, dysuria and hematuria. No depression or anxiety reported today. He is taking all medications as prescribed. He denies pain today. He is accompanied by his wife today.   Past Medical History:  Diagnosis Date  . [facility])    Renal cell carcinoma left kidney  . Severe uncontrolled hypertension 03/25/2020     Past Surgical History:  Procedure Laterality Date  . Dentures    . ROBOTIC ASSITED PARTIAL NEPHRECTOMY  2019  . SHOULDER SURGERY Left   . Surgery from [place]    . TONSILLECTOMY      Family History  Problem Relation Age of Onset  . Diabetes Mother   . Hypertension Mother   . Diabetes Father   . Hypertension Father   . Cancer Sister   . Heart disease Sister   . Stroke Brother   . Cancer Sister     Social History   Socioeconomic History  . Marital status: Married    Spouse name: Not on file  . Number of children: Not on file  . Years of education: Not on file  . Highest education level: Not on file  Occupational History  . Not on file  Tobacco Use  . Smoking status: Former Smoker    Types: Cigarettes    Quit date: 2012    Years since quitting: 10.2  . Smokeless tobacco: Never Used  Vaping Use  . Vaping Use: Every day  Substance and Sexual Activity  . Alcohol use: Yes  . Drug use: Not Currently    Types: Marijuana  . Sexual activity: Yes  Other Topics Concern  . Not on file  Social History Narrative  . Not on file   Social Determinants of Health   Financial Resource Strain: Not on file  Food Insecurity: Not on file  Transportation Needs: Not on file  Physical Activity: Not on file  Stress: Not on file  Social Connections: Not on file  Intimate Partner Violence: Not on file    Outpatient Medications Prior to Visit  Medication Sig Dispense Refill  . amLODipine (NORVASC) 5 MG tablet Take 1 tablet (5 mg total) by mouth daily. 30 tablet 2  . metoprolol tartrate (LOPRESSOR) 50 MG tablet Take 1 tablet (50 mg total) by mouth 2 (two) times daily. 60 tablet 2  . tamsulosin (FLOMAX) 0.4 MG CAPS capsule Take 1 capsule (0.4 mg total) by mouth daily. 30 capsule 2  . triamterene-hydrochlorothiazide (MAXZIDE-25) 37.5-25 MG tablet Take 1 tablet by mouth every morning. 30 tablet 2  . Vitamin D, Ergocalciferol, (DRISDOL) 1.25 MG (50000 UT) CAPS capsule TAKE ONE  CAPSULE BY MOUTH TWICE PER WEEK 50 capsule 0  . gabapentin (NEURONTIN) 300 MG capsule Take 1 capsule (300 mg total) by mouth 2 (two) times daily. 60 capsule 2  . oxyCODONE-acetaminophen (PERCOCET) 5-325 MG tablet Take 1 tablet by mouth every 6 (six) hours as needed for up to 5 days for severe pain. 20 tablet 0  . methadone (DOLOPHINE) 10 MG tablet Take 10 mg by mouth every 12 (twelve) hours.     No facility-administered medications prior to visit.    No Known Allergies  ROS Review of Systems  Constitutional: Negative.   HENT: Negative.   Eyes: Negative.   Respiratory: Negative.   Cardiovascular: Negative.   Gastrointestinal: Positive for abdominal distention (obese).  Endocrine: Negative.   Genitourinary: Negative.   Musculoskeletal: Positive for back pain (chronic).  Skin: Negative.   Allergic/Immunologic: Negative.   Neurological: Positive for dizziness (occasional ) and headaches (occasional).  Hematological: Negative.   Psychiatric/Behavioral: Positive for agitation. The patient is nervous/anxious and is hyperactive.       Objective:    Physical Exam Vitals and nursing note reviewed.  Constitutional:      Appearance: Normal appearance.  HENT:     Head: Normocephalic and atraumatic.     Nose: Nose normal.     Mouth/Throat:     Mouth: Mucous membranes are moist.     Pharynx: Oropharynx is clear.  Cardiovascular:     Rate and Rhythm: Normal rate and regular rhythm.     Pulses: Normal pulses.     Heart sounds: Normal heart sounds.  Pulmonary:     Effort: Pulmonary effort is normal.     Breath sounds: Normal breath sounds.  Abdominal:     General: Abdomen is flat. There is distension (obese).     Palpations: Abdomen is soft.  Musculoskeletal:        General: Normal range of motion.     Cervical back: Normal range of motion and neck supple.  Skin:    General: Skin is warm and dry.  Neurological:     General: No focal deficit present.     Mental Status: He is  alert and oriented to person, place, and time.  Psychiatric:        Thought Content: Thought content normal.        Judgment: Judgment normal.     Comments: anxious     BP (!) 144/65   Pulse 71   Ht 5\' 8"  (1.727 m)   Wt 260 lb (117.9 kg)   SpO2 98%   BMI 39.53 kg/m  Wt Readings from Last 3 Encounters:  03/29/20 260 lb (117.9 kg)  03/25/20 270 lb (122.5 kg)  02/05/18 233 lb 14.4 oz (106.1 kg)     Health Maintenance Due  Topic Date Due  . COVID-19 Vaccine (3 - Pfizer risk 4-dose series) 12/10/2019    There are no preventive care reminders to display for this patient.  Lab Results  Component Value Date   TSH 5.009 (H) 03/26/2020   Lab Results  Component Value Date   WBC 10.4 03/25/2020   HGB 13.4 03/25/2020   HCT 42.8 03/25/2020   MCV 84.6 03/25/2020   PLT 263 03/25/2020   Lab Results  Component Value Date   NA 137 03/26/2020   K 3.9 03/26/2020   CO2 23 03/26/2020   GLUCOSE 102 (H) 03/26/2020   BUN 18 03/26/2020   CREATININE 1.43 (H) 03/26/2020   BILITOT 1.0 03/25/2020   ALKPHOS 111 03/25/2020   AST 14 (L) 03/25/2020   ALT 9 03/25/2020   PROT 7.4 03/25/2020   ALBUMIN 3.8 03/25/2020   CALCIUM 9.1 03/26/2020   ANIONGAP 9 03/26/2020   Lab Results  Component Value Date   CHOL 196 03/26/2020   Lab Results  Component Value Date   HDL 37 (L) 03/26/2020   Lab Results  Component Value Date   LDLCALC 144 (H) 03/26/2020   Lab Results  Component Value Date   TRIG 73 03/26/2020   Lab Results  Component Value Date   CHOLHDL 5.3 03/26/2020   No results found for: HGBA1C    Assessment & Plan:   1. Encounter to establish care  2. Methadone withdrawal (HCC) Stable today. Patient referred to drug rehab by ED physician.   3. Essential hypertension The current medical regimen is effective; blood pressue is stable today; continue present plan and medications as prescribed. He will continue to take medications as prescribed, to decrease high sodium  intake, excessive alcohol intake, increase potassium intake, smoking cessation, and increase physical activity of at least 30 minutes of cardio activity daily. He will continue to follow Heart Healthy or DASH diet. - POCT urinalysis dipstick  4. Hypertension, unspecified type - CBC with Differential  5. Renal mass, left History of renal mass and partial left nephrectomy 2019. Post stability with no complaints.   6. Chronic bilateral low back pain with sciatica, sciatica laterality unspecified Gabapentin increased to 300 mg TID. Monitor.  - gabapentin (NEURONTIN) 300 MG capsule; Take 1 capsule (300 mg total) by mouth 3 (three) times daily.  Dispense: 90 capsule; Refill: 2 - ketorolac (TORADOL) injection 60 mg  7. Healthcare maintenance - Comprehensive metabolic panel; Future - TSH - Lipid Panel - PSA  8. Follow up He will follow up in 3 months.   Meds ordered this encounter  Medications  . gabapentin (NEURONTIN) 300 MG capsule    Sig: Take 1 capsule (300 mg total) by mouth 3 (three) times daily.    Dispense:  90 capsule    Refill:  2  . ketorolac (TORADOL) injection 60 mg   Orders Placed This Encounter  Procedures  . CBC with Differential  . Comprehensive metabolic panel  . TSH  . Lipid Panel  . Vitamin B12  . Vitamin D, 25-hydroxy  . PSA  . POCT urinalysis dipstick    Referral Orders  No referral(s) requested today    Kathe Becton, MSN, ANE, FNP-BC [facility] [place] [address] [place], [place] [address] [phone] fax   Problem List Items Addressed This Visit      Cardiovascular and Mediastinum   Severe uncontrolled hypertension     Other   Left renal mass  Other Visit Diagnoses    Encounter to establish care    -  Primary   Methadone withdrawal ([place])       Hypertension, unspecified type       Relevant Orders   CBC with Differential   Chronic  bilateral low back pain with sciatica, sciatica laterality unspecified       Relevant Medications   gabapentin (NEURONTIN) 300 MG capsule   ketorolac (TORADOL) injection 60 mg (Completed)   Healthcare maintenance       Relevant Orders   Comprehensive metabolic panel   TSH   Lipid Panel   PSA   Vitamin D deficiency       Relevant Orders   Vitamin D, 25-hydroxy   Vitamin B12 deficiency       Relevant Orders   Vitamin B12   Follow up          Meds ordered this encounter  Medications  . gabapentin (NEURONTIN) 300 MG capsule    Sig: Take 1 capsule (300 mg total) by mouth 3 (three) times daily.    Dispense:  90 capsule    Refill:  2  . ketorolac (TORADOL) injection 60 mg    Follow-up: No follow-ups on file.    Azzie Glatter, FNP

## 2020-03-30 LAB — CBC WITH DIFFERENTIAL/PLATELET
Basophils Absolute: 0.1 10*3/uL (ref 0.0–0.2)
Basos: 1 %
EOS (ABSOLUTE): 0.1 10*3/uL (ref 0.0–0.4)
Eos: 1 %
Hematocrit: 42.4 % (ref 37.5–51.0)
Hemoglobin: 13.2 g/dL (ref 13.0–17.7)
Immature Grans (Abs): 0 10*3/uL (ref 0.0–0.1)
Immature Granulocytes: 0 %
Lymphocytes Absolute: 2.9 10*3/uL (ref 0.7–3.1)
Lymphs: 22 %
MCH: 26 pg — ABNORMAL LOW (ref 26.6–33.0)
MCHC: 31.1 g/dL — ABNORMAL LOW (ref 31.5–35.7)
MCV: 84 fL (ref 79–97)
Monocytes Absolute: 1 10*3/uL — ABNORMAL HIGH (ref 0.1–0.9)
Monocytes: 7 %
Neutrophils Absolute: 9.3 10*3/uL — ABNORMAL HIGH (ref 1.4–7.0)
Neutrophils: 69 %
Platelets: 288 10*3/uL (ref 150–450)
RBC: 5.07 x10E6/uL (ref 4.14–5.80)
RDW: 12.7 % (ref 11.6–15.4)
WBC: 13.4 10*3/uL — ABNORMAL HIGH (ref 3.4–10.8)

## 2020-03-30 LAB — LIPID PANEL
Chol/HDL Ratio: 3.2 ratio (ref 0.0–5.0)
Cholesterol, Total: 146 mg/dL (ref 100–199)
HDL: 45 mg/dL (ref >39)
LDL Chol Calc (NIH): 86 mg/dL (ref 0–99)
Triglycerides: 77 mg/dL (ref 0–149)
VLDL Cholesterol Cal: 15 mg/dL (ref 5–40)

## 2020-03-30 LAB — TSH: TSH: 2.48 u[IU]/mL (ref 0.450–4.500)

## 2020-03-30 LAB — VITAMIN D 25 HYDROXY (VIT D DEFICIENCY, FRACTURES): Vit D, 25-Hydroxy: 39.3 ng/mL (ref 30.0–100.0)

## 2020-03-30 LAB — VITAMIN B12: Vitamin B-12: 853 pg/mL (ref 232–1245)

## 2020-03-30 LAB — PSA: Prostate Specific Ag, Serum: 1.2 ng/mL (ref 0.0–4.0)

## 2020-06-30 ENCOUNTER — Ambulatory Visit: Payer: Medicare Other | Admitting: Family Medicine

## 2020-06-30 ENCOUNTER — Ambulatory Visit: Payer: Medicare Other | Admitting: Nurse Practitioner

## 2020-09-29 ENCOUNTER — Telehealth: Payer: Self-pay

## 2020-09-29 NOTE — Telephone Encounter (Signed)
Called pt to schedule AWV, no answer, LVMTRC. 

## 2022-04-20 IMAGING — CR DG LUMBAR SPINE COMPLETE 4+V
4 series · 4 of 4 positions shown · non-contrast
Comparison: None.

CLINICAL DATA: Evaluate for possible multiple myeloma.  Back pain.

EXAM:
LUMBAR SPINE - COMPLETE 4+ VIEW

[t lumbar spine obl (1 of 2)]
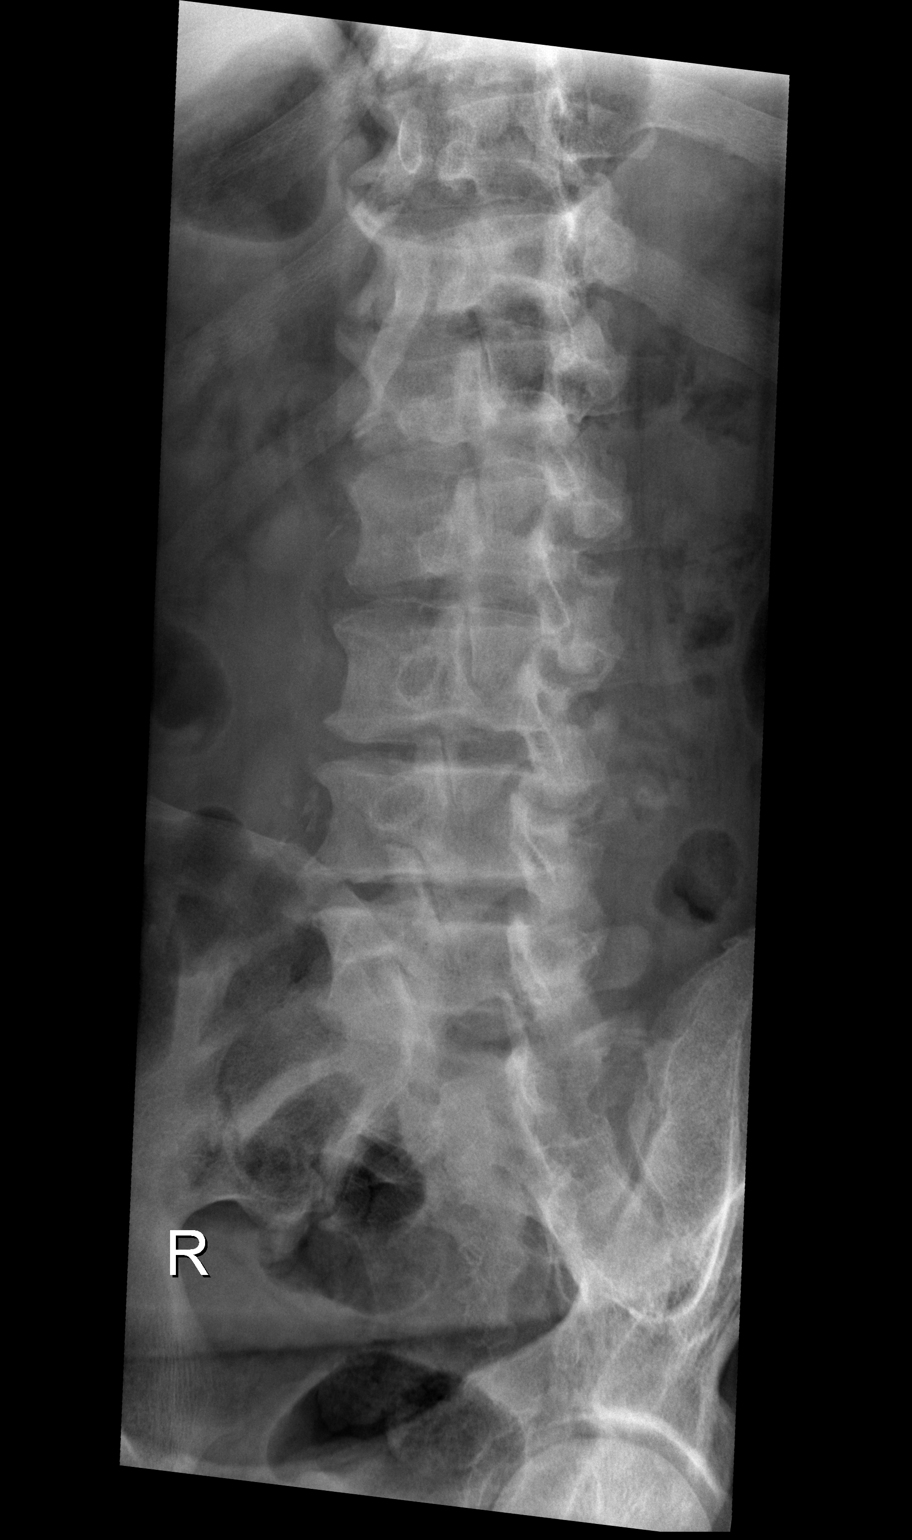

[t lumbar spine obl (2 of 2)]
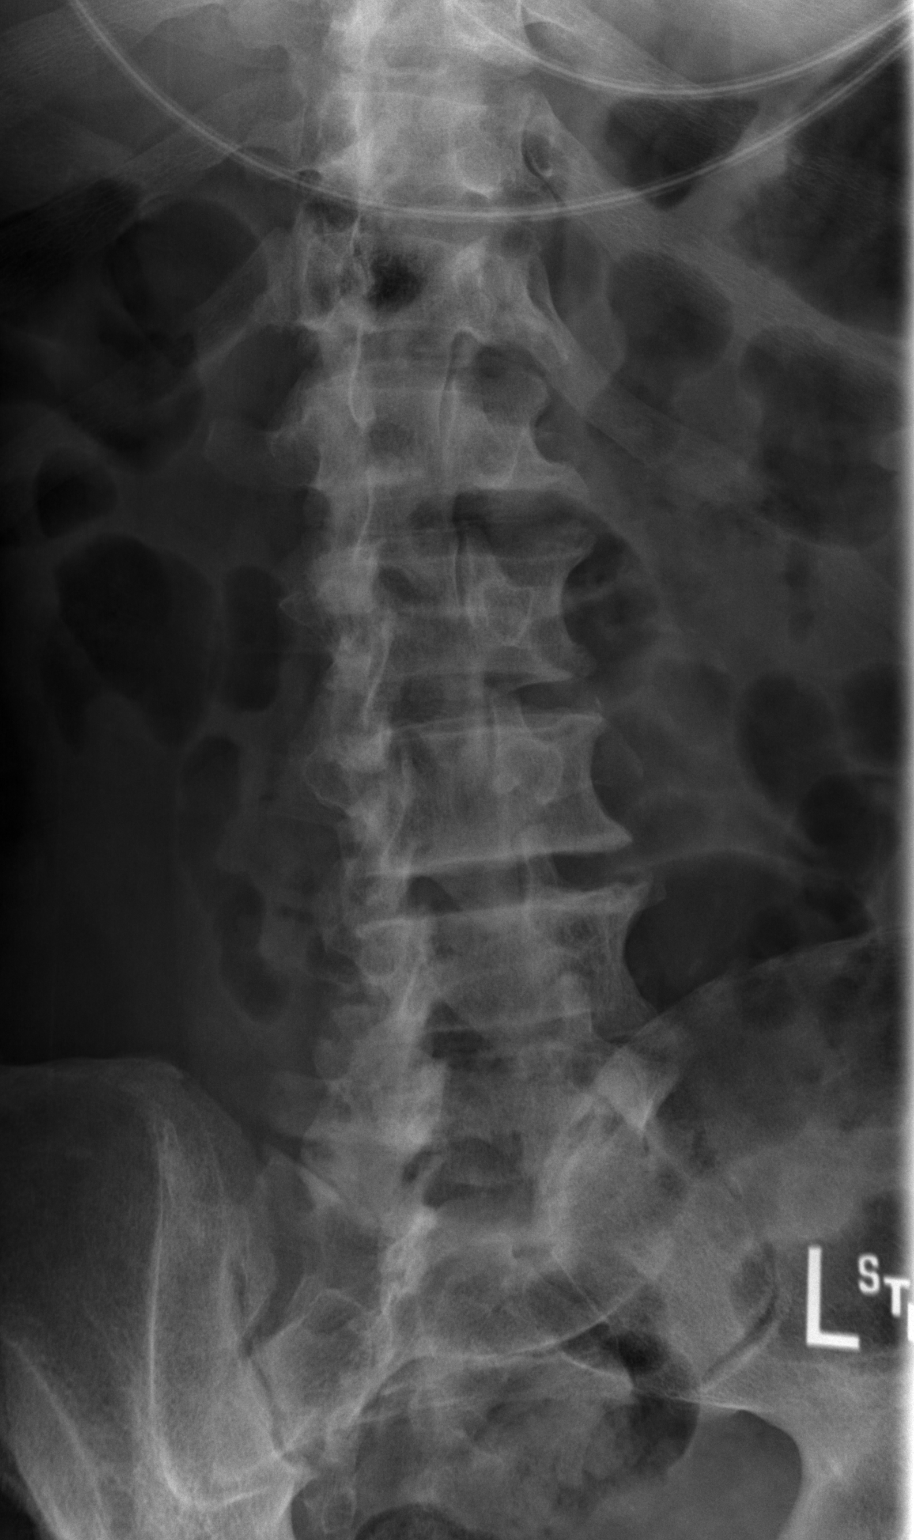

[t lumbar spine lat]
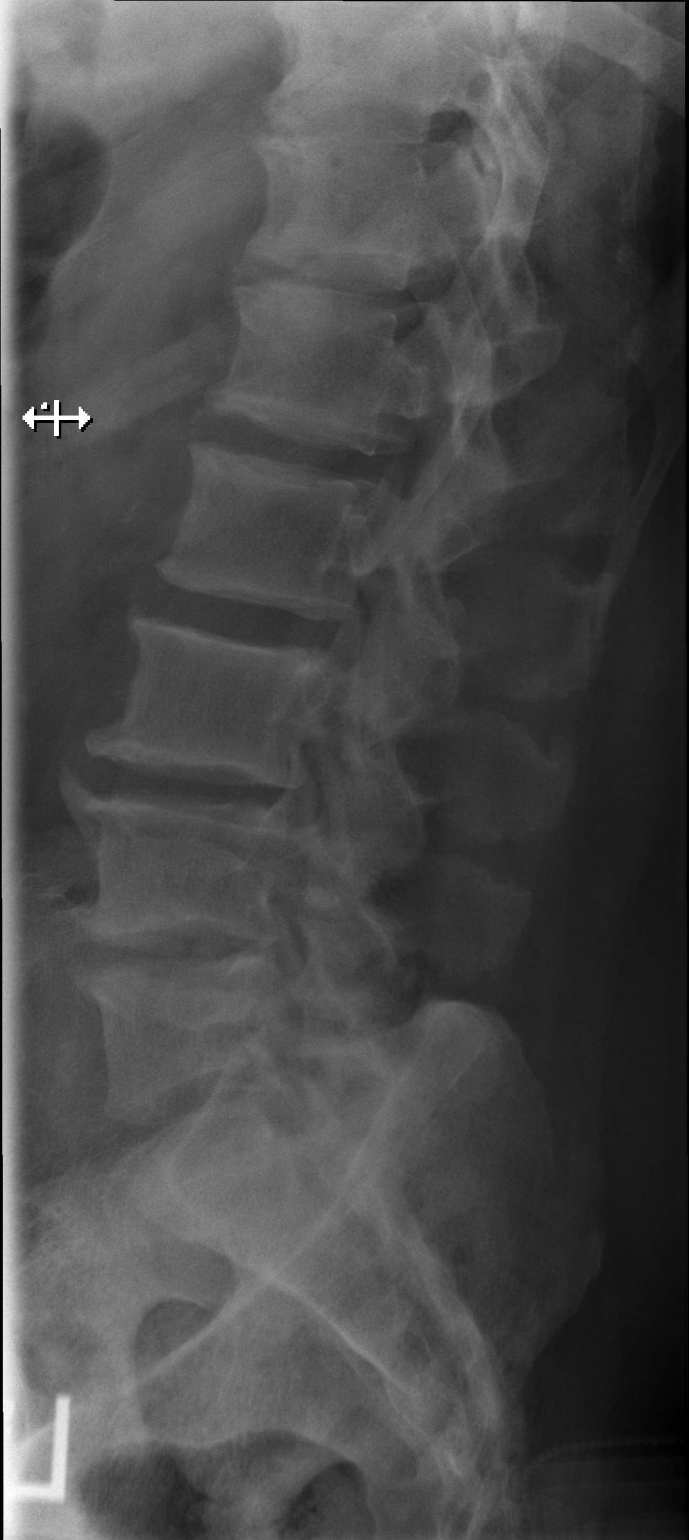

[t lumbar l-5 s-1 spot]
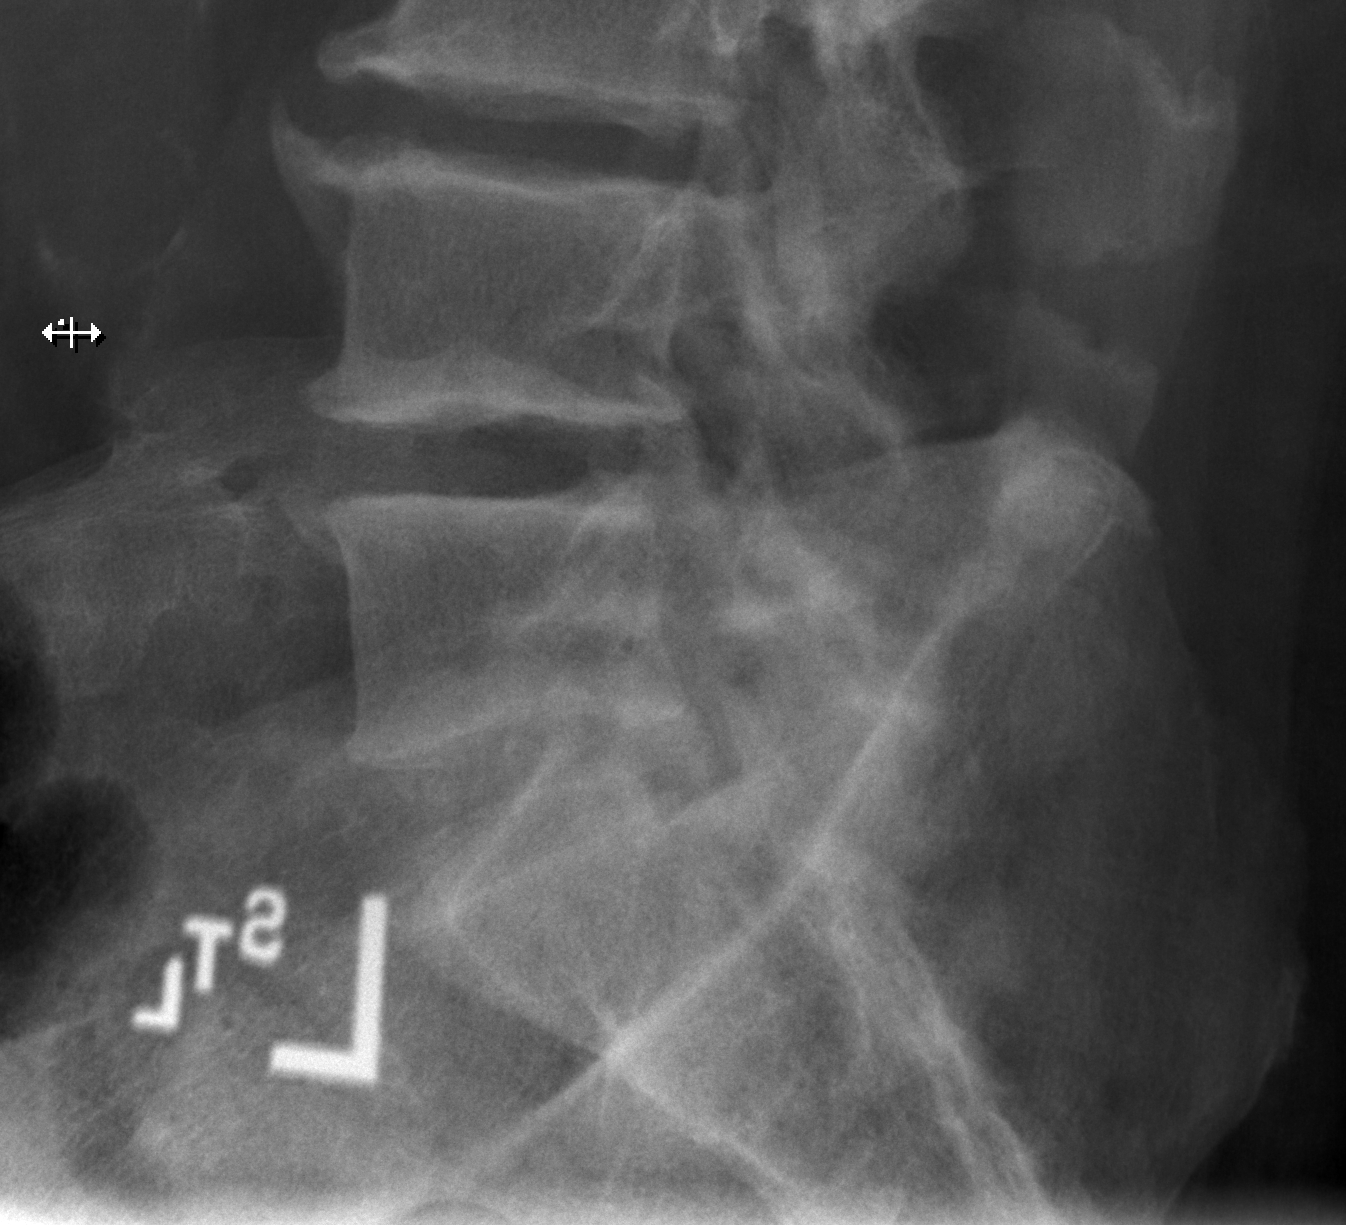

[4 of 4 positions shown; findings below may reference images not displayed]

FINDINGS: Degenerative disc disease in the lumbar spine. There is a focus of
low attenuation of the superior aspect of T12. No other bony lesions
are identified. No fracture or traumatic malalignment. No other
acute abnormalities.
IMPRESSION: 1. There is a small focus of low attenuation of the superior aspect
of T12 which is nonspecific. This could represent artifact versus a
small lucent lesion.
2. Degenerative changes.
3. No other abnormalities.

## 2022-04-20 IMAGING — CR DG CHEST 2V
2 series · 2 of 2 positions shown · non-contrast
Comparison: PET-CT September 26, 2017

CLINICAL DATA: Chest pain.  History of multiple myeloma

EXAM:
CHEST - 2 VIEW

[w chest lat]
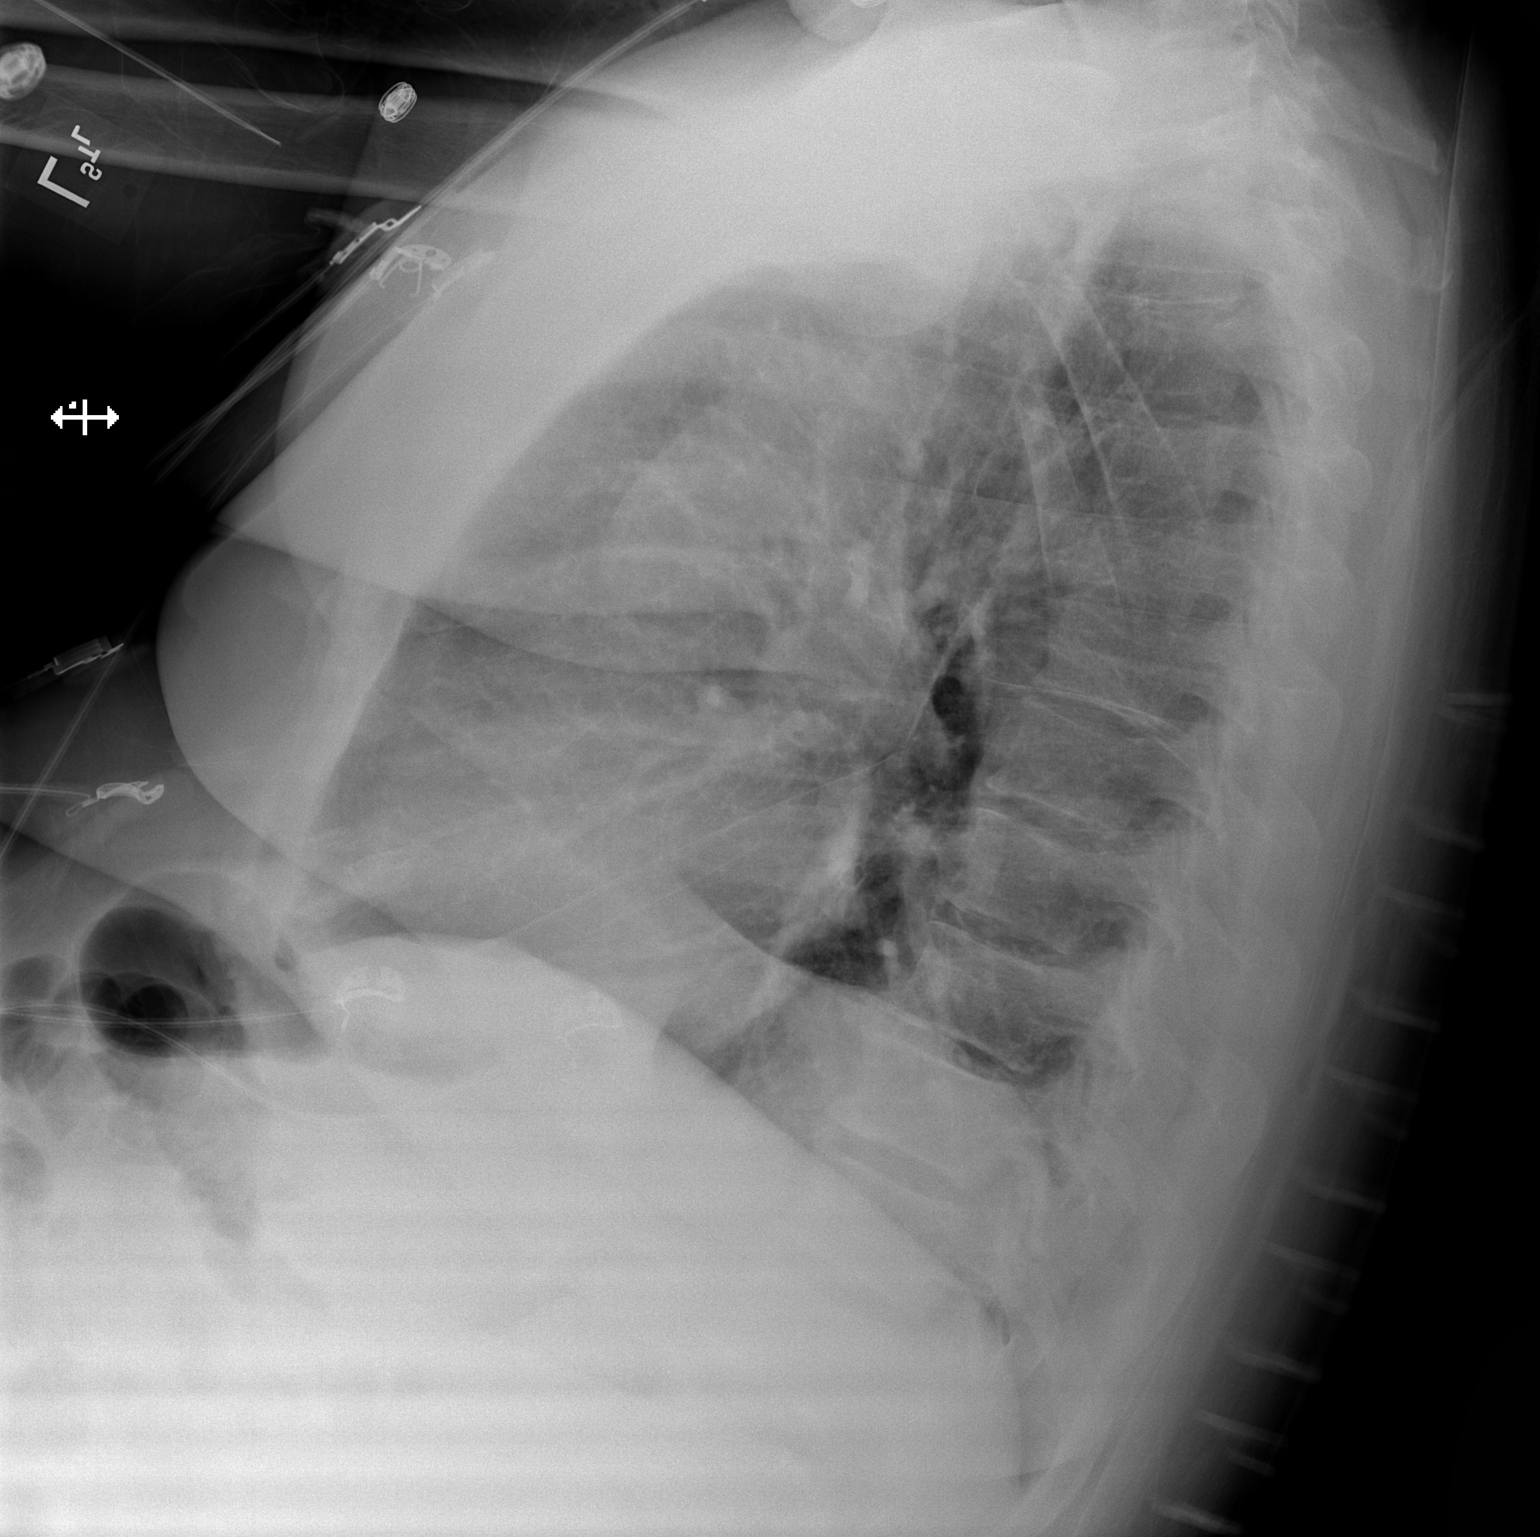

[x chest ap]
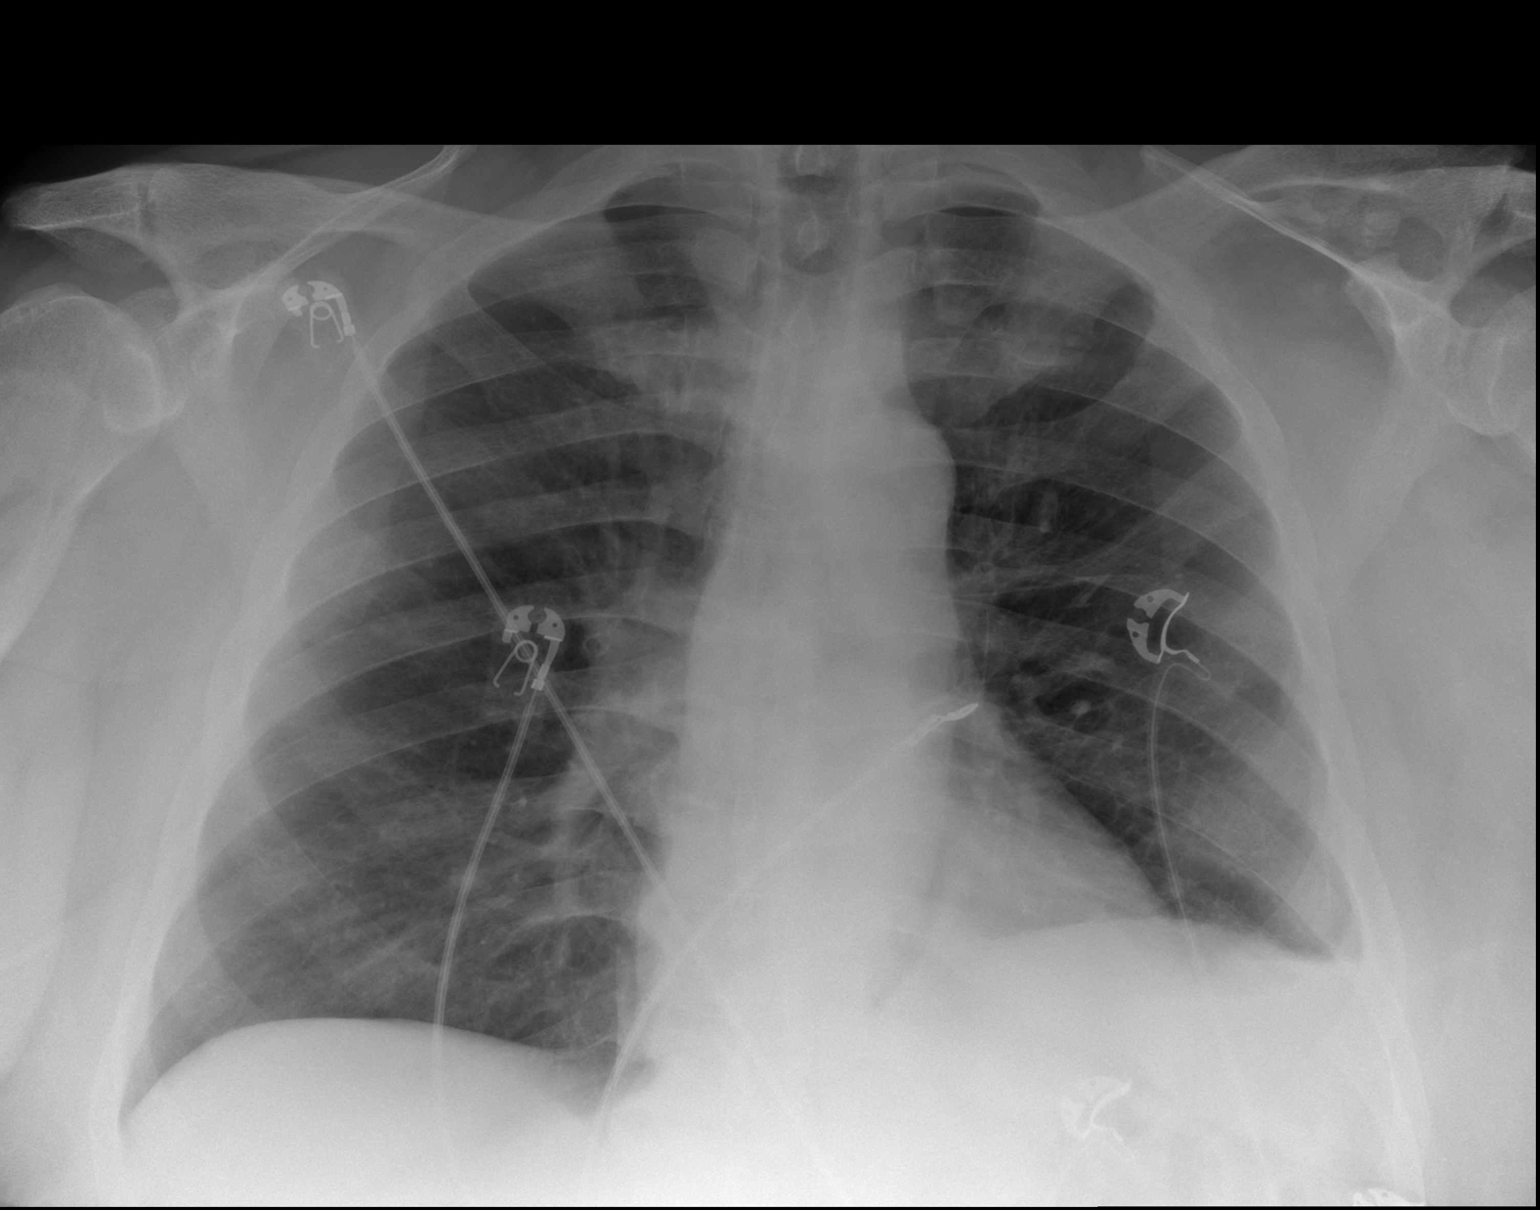

[2 of 2 positions shown; findings below may reference images not displayed]

FINDINGS: There is a small left pleural effusion. The lungs elsewhere are
clear. Heart size and pulmonary vascularity are normal. No blastic
or lytic bone lesions. Bony overgrowth along the lateral left
clavicle noted.
IMPRESSION: Small left pleural effusion. No edema or airspace opacity. Heart
size normal. No findings suggestive of multiple myeloma by
radiographic examination.
